# Patient Record
Sex: Male | Born: 1975 | Race: White | Hispanic: No | Smoking: Current every day smoker
Health system: Southern US, Community
[De-identification: ages and names within clinical notes are randomized; demographics above are authoritative.]

## PROBLEM LIST (undated history)

## (undated) DIAGNOSIS — K219 Gastro-esophageal reflux disease without esophagitis: Secondary | ICD-10-CM

## (undated) DIAGNOSIS — I10 Essential (primary) hypertension: Secondary | ICD-10-CM

## (undated) DIAGNOSIS — Z944 Liver transplant status: Secondary | ICD-10-CM

## (undated) DIAGNOSIS — E119 Type 2 diabetes mellitus without complications: Secondary | ICD-10-CM

---

## 2013-04-29 ENCOUNTER — Emergency Department (HOSPITAL_COMMUNITY): Payer: Self-pay

## 2013-04-29 ENCOUNTER — Emergency Department (HOSPITAL_COMMUNITY)
Admission: EM | Admit: 2013-04-29 | Discharge: 2013-04-30 | Disposition: A | Discharge status: Home / Self Care | Payer: Self-pay | Attending: Emergency Medicine | Admitting: Emergency Medicine

## 2013-04-29 ENCOUNTER — Encounter (HOSPITAL_COMMUNITY): Payer: Self-pay | Admitting: Emergency Medicine

## 2013-04-29 DIAGNOSIS — R059 Cough, unspecified: Secondary | ICD-10-CM | POA: Insufficient documentation

## 2013-04-29 DIAGNOSIS — R55 Syncope and collapse: Secondary | ICD-10-CM | POA: Insufficient documentation

## 2013-04-29 DIAGNOSIS — W11XXXA Fall on and from ladder, initial encounter: Secondary | ICD-10-CM | POA: Insufficient documentation

## 2013-04-29 DIAGNOSIS — Y9389 Activity, other specified: Secondary | ICD-10-CM | POA: Insufficient documentation

## 2013-04-29 DIAGNOSIS — J069 Acute upper respiratory infection, unspecified: Secondary | ICD-10-CM | POA: Insufficient documentation

## 2013-04-29 DIAGNOSIS — F29 Unspecified psychosis not due to a substance or known physiological condition: Secondary | ICD-10-CM | POA: Insufficient documentation

## 2013-04-29 DIAGNOSIS — Y929 Unspecified place or not applicable: Secondary | ICD-10-CM | POA: Insufficient documentation

## 2013-04-29 DIAGNOSIS — R6883 Chills (without fever): Secondary | ICD-10-CM | POA: Insufficient documentation

## 2013-04-29 DIAGNOSIS — Y9321 Activity, ice skating: Secondary | ICD-10-CM | POA: Insufficient documentation

## 2013-04-29 DIAGNOSIS — Y990 Civilian activity done for income or pay: Secondary | ICD-10-CM | POA: Insufficient documentation

## 2013-04-29 DIAGNOSIS — S298XXA Other specified injuries of thorax, initial encounter: Secondary | ICD-10-CM | POA: Insufficient documentation

## 2013-04-29 DIAGNOSIS — Z8719 Personal history of other diseases of the digestive system: Secondary | ICD-10-CM | POA: Insufficient documentation

## 2013-04-29 DIAGNOSIS — R05 Cough: Secondary | ICD-10-CM | POA: Insufficient documentation

## 2013-04-29 DIAGNOSIS — F17200 Nicotine dependence, unspecified, uncomplicated: Secondary | ICD-10-CM | POA: Insufficient documentation

## 2013-04-29 HISTORY — DX: Gastro-esophageal reflux disease without esophagitis: K21.9

## 2013-04-29 NOTE — ED Provider Notes (Signed)
CSN: [id number]     Arrival date & time 04/29/13  2200 History     First MD Initiated Contact with Patient 04/29/13 2309     Chief Complaint  Patient presents with  . Syncope    (Consider location/radiation/quality/duration/timing/severity/associated sxs/prior Treatment) HPI  37 year old male presents to the emergency department with chief complaint of cough and syncope.  The patient states that last night he began having paroxysms of severe or coughing.  Nonproductive.  He has had intermittent hot and cold chills.  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  Today he was about 5 feet up on his ladder when he became dizzy and fell off the ladder and passed out.  Patient states that his coworkers say he was confused for approximately 5-10 minutes, however it he denies any other symptoms of seizure such as convulsion, loss of bowel or bladder or tongue biting.  She has no history of seizure or syncope.  Patient states that he had a second episode of syncope while he was riding in the car with his friend.  He states that he had a final episode of syncope at home today. He had a paroxysm of cough and felt a ripping pain in his chest. and his girlfriend have him come to the hospital.  The patient is complaining of feeling hot all over.  Unsure if he is running fevers.  Current daily smoker.  He denies a history of DVT or PE.  He denies a history of an unilateral calf pain or swelling.  Past Medical History  Diagnosis Date  . GERD (gastroesophageal reflux disease)    History reviewed. No pertinent past surgical history. Family History  Problem Relation Age of Onset  . Mental illness Mother   . Mental illness Brother   . Emphysema Other   . Kidney failure Other    History  Substance Use Topics  . Smoking status: Current Every Day Smoker -- 1.00 packs/day    Types: Cigarettes  . Smokeless tobacco: Not on file  . Alcohol Use: Yes     Comment: occ     Review of Systems Ten systems reviewed and are negative for acute change, except as noted in the HPI.   Allergies  Review of patient's allergies indicates no known allergies.  Home Medications  No current outpatient prescriptions on file. BP 153/77  Pulse 85  Temp(Src) 99 F (37.2 C) (Oral)  Resp 26  SpO2 99% Physical Exam Physical Exam  Nursing note and vitals reviewed. Constitutional: He appears well-developed and well-nourished. No distress.  HENT:  Head: Normocephalic and atraumatic.  Eyes: Conjunctivae normal are normal. No scleral icterus.  Neck: Normal range of motion. Neck supple.  Cardiovascular: Normal rate, regular rhythm and normal heart sounds.   Pulmonary/Chest: Effort normal and breath sounds normal.  Hacking dry cough..  Abdominal: Soft. There is no tenderness.  Musculoskeletal: He exhibits no edema.  Neurological: He is alert.  Skin: Skin is warm,  And flushed Psychiatric: His behavior is normal.    ED Course   Procedures (including critical care time)  Labs Reviewed  BASIC METABOLIC PANEL - Abnormal; Notable for the following:    Glucose, Bld 108 (*)    All other components within normal limits  CBC   No results found. 1. URI (upper respiratory infection)   2. Syncope   3. Smoker       Date: 04/30/2013  Rate: 85  Rhythm: normal sinus rhythm  QRS Axis: normal  Intervals: normal  ST/T Wave abnormalities: normal  Conduction Disutrbances:borderline intraventricular conduction delay  Narrative Interpretation: abnormal EKG, borderline q waves inferior leads.  Old EKG Reviewed: none available   MDM  Patient with a couple of friends, cough and tearing chest pain.  He has a well score of 0 and he has perked negative.  Concern for her bronchitis versus pneumonia.  Tearing chest pain concerning for chest wall muscle tear versus dissecting aortic aneurysm although his pain is currntly moderate. He is hypertensive. He has taken tylonol cold and  flu without relif and aleve.     1:25 AM  Patient with negative chest x-ray.  I personally reviewed the chest x-rays in our PACS system.. Patient has abnormal EKG with borderline Q waves in the inferior leads.  He also has borderline intraventricular conduction delay. I have reviewed the EKG with Dr. Read Drivers who interprets it as normal. Troponin istat   2:00 AM Patient with negative troponin. I feel the patient is safe for discharge at this point. folllow up with cardiology and community health, Hycodan. Smoking cessation.  Arthor Captain, PA-C 05/01/13 [phone]

## 2013-04-29 NOTE — ED Notes (Signed)
Pt states he started feeling bad last night and woke up this morning and still didn't feel good  Pt states today he has had some pain in his chest but states he has had a cough all day  Pt coughing in triage  Pt states he had a syncopal episode at work today  Pt states this evening he coughed and had a pain run from his chest down his left arm causing it to feel numb  Pt states his cough has been uncontrollable all day

## 2013-04-30 LAB — CBC
MCH: 31 pg (ref 26.0–34.0)
MCHC: 33.8 g/dL (ref 30.0–36.0)
Platelets: 158 10*3/uL (ref 150–400)
RBC: 4.8 MIL/uL (ref 4.22–5.81)

## 2013-04-30 LAB — BASIC METABOLIC PANEL
Calcium: 9.2 mg/dL (ref 8.4–10.5)
GFR calc Af Amer: >90 mL/min (ref >90)
GFR calc non Af Amer: >90 mL/min (ref >90)
Sodium: 140 mEq/L (ref 135–145)

## 2013-04-30 LAB — POCT I-STAT TROPONIN I: Troponin i, poc: 0 ng/mL (ref 0.00–0.08)

## 2013-04-30 MED ORDER — ALBUTEROL SULFATE HFA 108 (90 BASE) MCG/ACT IN AERS: 2.0000 | INHALATION_SPRAY | RESPIRATORY_TRACT | Status: DC | PRN

## 2013-04-30 MED ORDER — HYDROCODONE-HOMATROPINE 5-1.5 MG/5ML PO SYRP
5.0000 mL | ORAL_SOLUTION | Freq: Once | ORAL | Status: AC
  Administered 2013-04-30: 5 mL via ORAL
  Filled 2013-04-30: qty 5

## 2013-04-30 MED ORDER — HYDROCODONE-HOMATROPINE 5-1.5 MG/5ML PO SYRP: 2.5000 mL | ORAL_SOLUTION | Freq: Four times a day (QID) | ORAL | Status: DC | PRN

## 2013-04-30 NOTE — ED Notes (Addendum)
Pt given water at this time 

## 2013-05-04 NOTE — ED Provider Notes (Signed)
Medical screening examination/treatment/procedure(s) were performed by non-physician practitioner and as supervising physician I was immediately available for consultation/collaboration.   Hanley Seamen, MD 05/04/13 [phone]

## 2013-05-06 ENCOUNTER — Emergency Department (HOSPITAL_COMMUNITY)
Admission: EM | Admit: 2013-05-06 | Discharge: 2013-05-07 | Disposition: A | Discharge status: Home / Self Care | Payer: Self-pay | Attending: Emergency Medicine | Admitting: Emergency Medicine

## 2013-05-06 ENCOUNTER — Encounter (HOSPITAL_COMMUNITY): Payer: Self-pay | Admitting: Emergency Medicine

## 2013-05-06 DIAGNOSIS — T675XXA Heat exhaustion, unspecified, initial encounter: Secondary | ICD-10-CM | POA: Insufficient documentation

## 2013-05-06 DIAGNOSIS — E860 Dehydration: Secondary | ICD-10-CM | POA: Insufficient documentation

## 2013-05-06 DIAGNOSIS — F17200 Nicotine dependence, unspecified, uncomplicated: Secondary | ICD-10-CM | POA: Insufficient documentation

## 2013-05-06 DIAGNOSIS — X30XXXA Exposure to excessive natural heat, initial encounter: Secondary | ICD-10-CM | POA: Insufficient documentation

## 2013-05-06 DIAGNOSIS — Y929 Unspecified place or not applicable: Secondary | ICD-10-CM | POA: Insufficient documentation

## 2013-05-06 DIAGNOSIS — Y939 Activity, unspecified: Secondary | ICD-10-CM | POA: Insufficient documentation

## 2013-05-06 LAB — CBC WITH DIFFERENTIAL/PLATELET
Basophils Absolute: 0.1 10*3/uL (ref 0.0–0.1)
Basophils Relative: 0 % (ref 0–1)
MCHC: 34.5 g/dL (ref 30.0–36.0)
Monocytes Absolute: 1.1 10*3/uL — ABNORMAL HIGH (ref 0.1–1.0)
Neutro Abs: 7.8 10*3/uL — ABNORMAL HIGH (ref 1.7–7.7)
Neutrophils Relative %: 67 % (ref 43–77)
Platelets: 174 10*3/uL (ref 150–400)
RDW: 13.9 % (ref 11.5–15.5)

## 2013-05-06 LAB — COMPREHENSIVE METABOLIC PANEL
AST: 154 U/L — ABNORMAL HIGH (ref 0–37)
Albumin: 4.3 g/dL (ref 3.5–5.2)
Chloride: 98 mEq/L (ref 96–112)
Creatinine, Ser: 1.33 mg/dL (ref 0.50–1.35)
Potassium: 4 mEq/L (ref 3.5–5.1)
Total Bilirubin: 0.7 mg/dL (ref 0.3–1.2)

## 2013-05-06 LAB — URINE MICROSCOPIC-ADD ON

## 2013-05-06 LAB — URINALYSIS, ROUTINE W REFLEX MICROSCOPIC
Bilirubin Urine: NEGATIVE
Leukocytes, UA: NEGATIVE
Nitrite: NEGATIVE
Specific Gravity, Urine: 1.021 (ref 1.005–1.030)
pH: 5.5 (ref 5.0–8.0)

## 2013-05-06 LAB — CK: Total CK: 536 U/L — ABNORMAL HIGH (ref 7–232)

## 2013-05-06 MED ORDER — DIPHENHYDRAMINE HCL 50 MG/ML IJ SOLN
25.0000 mg | Freq: Once | INTRAMUSCULAR | Status: AC
  Administered 2013-05-06: 25 mg via INTRAVENOUS
  Filled 2013-05-06: qty 1

## 2013-05-06 MED ORDER — ONDANSETRON HCL 4 MG/2ML IJ SOLN
4.0000 mg | Freq: Once | INTRAMUSCULAR | Status: AC
  Administered 2013-05-06: 4 mg via INTRAVENOUS
  Filled 2013-05-06: qty 2

## 2013-05-06 MED ORDER — SODIUM CHLORIDE 0.9 % IV BOLUS (SEPSIS)
2000.0000 mL | Freq: Once | INTRAVENOUS | Status: AC
  Administered 2013-05-06: 1000 mL via INTRAVENOUS
  Administered 2013-05-06: 2000 mL via INTRAVENOUS

## 2013-05-06 MED ORDER — OXYCODONE-ACETAMINOPHEN 5-325 MG PO TABS: 1.0000 | ORAL_TABLET | Freq: Four times a day (QID) | ORAL | Status: DC | PRN

## 2013-05-06 MED ORDER — MORPHINE SULFATE 4 MG/ML IJ SOLN
4.0000 mg | Freq: Once | INTRAMUSCULAR | Status: AC
  Administered 2013-05-06: 4 mg via INTRAVENOUS
  Filled 2013-05-06: qty 1

## 2013-05-06 NOTE — ED Provider Notes (Signed)
TIME SEEN: 9:07 PM  CHIEF COMPLAINT: Overheated, diffuse cramping  HPI: Patient is a 37 year old male with a history of acid reflux who presents to the emergency department with diffuse body cramping, nausea. 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 He reports he only drank 2 colas today and 1 Gatorade. He states he's had similar symptoms once before with dehydration. He was not admitted at that time. He was seen at [facility]. He denies that he's had any vomiting but he has had nausea. No diarrhea. No hematuria. He has been able to urinate. No fever. He has been sweating today.  ROS: See HPI Constitutional: no fever  Eyes: no drainage  ENT: no runny nose   Cardiovascular:  no chest pain  Resp: no SOB  GI: no vomiting GU: no dysuria Integumentary: no rash  Allergy: no hives  Musculoskeletal: no leg swelling  Neurological: no slurred speech ROS otherwise negative  PAST MEDICAL HISTORY/PAST SURGICAL HISTORY:  Past Medical History  Diagnosis Date  . GERD (gastroesophageal reflux disease)     MEDICATIONS:  Prior to Admission medications   Medication Sig Start Date End Date Taking? Authorizing Provider  albuterol (PROVENTIL HFA;VENTOLIN HFA) 108 (90 BASE) MCG/ACT inhaler Inhale 2 puffs into the lungs every 4 (four) hours as needed for wheezing. 04/30/13   Arthor Captain, PA-C  HYDROcodone-homatropine (HYCODAN) 5-1.5 MG/5ML syrup Take 2.5 mL by mouth every 6 (six) hours as needed for cough. 04/30/13   Arthor Captain, PA-C    ALLERGIES:  No Known Allergies  SOCIAL HISTORY:  History  Substance Use Topics  . Smoking status: Current Every Day Smoker -- 1.00 packs/day    Types: Cigarettes  . Smokeless tobacco: Not on file  . Alcohol Use: Yes     Comment: occ    FAMILY HISTORY: Family History  Problem Relation Age of Onset  . Mental illness Mother   . Mental illness Brother   . Emphysema Other   . Kidney failure Other   .  Gout Father     EXAM: BP 121/71  Pulse 68  Temp(Src) 97.6 F (36.4 C) (Oral)  Resp 24  SpO2 95% CONSTITUTIONAL: Alert and oriented and responds appropriately to questions. Patient appears uncomfortable but is nontoxic HEAD: Normocephalic EYES: Conjunctivae clear, PERRL ENT: normal nose; no rhinorrhea; dry mucous membranes; pharynx without lesions noted NECK: Supple, no meningismus, no LAD  CARD: RRR; S1 and S2 appreciated; no murmurs, no clicks, no rubs, no gallops RESP: Normal chest excursion without splinting or tachypnea; breath sounds clear and equal bilaterally; no wheezes, no rhonchi, no rales,  ABD/GI: Normal bowel sounds; non-distended; soft, non-tender, no rebound, no guarding BACK:  The back appears normal and is non-tender to palpation, there is no CVA tenderness EXT: Normal ROM in all joints; non-tender to palpation; no edema; normal capillary refill; no cyanosis    SKIN: Normal color for age and race; warm NEURO: Moves all extremities equally PSYCH: The patient's mood and manner are appropriate. Grooming and personal hygiene are appropriate.  MEDICAL DECISION MAKING: Patient likely with heat exhaustion. Will obtain labs to evaluate for electrolyte abnormality, rhabdomyolysis. We'll give IV fluids and pain medication. Anticipate discharge home. Vital signs within normal limits.  ED PROGRESS: Patient's labs are unremarkable other than a slightly elevated creatinine at 1.33 and a CK greater than 500. He has received 2 L of IV fluids and has been able to tolerate by mouth without difficulty. He  reports feeling much better. Patient is ready for discharge home. Discussed with patient at length return precautions and importance for staying well hydrated. Instructed patient to stay out of the sun for the next several days. Patient verbalizes understanding is comfortable with this plan.   Layla Maw Chennel Olivos, DO 05/06/13 2328

## 2013-05-06 NOTE — ED Provider Notes (Signed)
D/w signs and symptoms of rhabdomyolysis.  His creatinine is slightly elevated but within normal limits for his size. Pt able to tolerate po and I feel he will be able to keep himself hydrated at home.  Will have him f/u to have Cr rechecked in 2-3 days.  LFTs also slightly elevated.  Pt denies h/o heavy ETOH use.  No RUQ pain on exam or hepatomegaly.  Will have pt f/u for recheck of LFTs.  Layla Maw Ward, DO 05/06/13 2332

## 2013-05-06 NOTE — ED Notes (Signed)
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

## 2014-06-13 ENCOUNTER — Emergency Department (HOSPITAL_COMMUNITY)
Admission: EM | Admit: 2014-06-13 | Discharge: 2014-06-13 | Disposition: A | Discharge status: Home / Self Care | Payer: Self-pay | Attending: Emergency Medicine | Admitting: Emergency Medicine

## 2014-06-13 ENCOUNTER — Encounter (HOSPITAL_COMMUNITY): Payer: Self-pay | Admitting: Emergency Medicine

## 2014-06-13 DIAGNOSIS — Z79899 Other long term (current) drug therapy: Secondary | ICD-10-CM | POA: Insufficient documentation

## 2014-06-13 DIAGNOSIS — K089 Disorder of teeth and supporting structures, unspecified: Secondary | ICD-10-CM | POA: Insufficient documentation

## 2014-06-13 DIAGNOSIS — K219 Gastro-esophageal reflux disease without esophagitis: Secondary | ICD-10-CM | POA: Insufficient documentation

## 2014-06-13 DIAGNOSIS — K0889 Other specified disorders of teeth and supporting structures: Secondary | ICD-10-CM

## 2014-06-13 DIAGNOSIS — F17200 Nicotine dependence, unspecified, uncomplicated: Secondary | ICD-10-CM | POA: Insufficient documentation

## 2014-06-13 DIAGNOSIS — K029 Dental caries, unspecified: Secondary | ICD-10-CM | POA: Insufficient documentation

## 2014-06-13 MED ORDER — AMOXICILLIN 500 MG PO CAPS
500.0000 mg | ORAL_CAPSULE | Freq: Three times a day (TID) | ORAL | Status: DC
Start: 2014-06-13 — End: 2024-02-20

## 2014-06-13 MED ORDER — BUPIVACAINE-EPINEPHRINE (PF) 0.5% -1:200000 IJ SOLN
1.8000 mL | Freq: Once | INTRAMUSCULAR | Status: AC
  Administered 2014-06-13: 1.8 mL
  Filled 2014-06-13: qty 1.8

## 2014-06-13 MED ORDER — OXYCODONE-ACETAMINOPHEN 5-325 MG PO TABS: 1.0000 | ORAL_TABLET | Freq: Four times a day (QID) | ORAL | Status: DC | PRN

## 2014-06-13 NOTE — Discharge Instructions (Signed)
Dental Pain °A tooth ache may be caused by cavities (tooth decay). Cavities expose the nerve of the tooth to air and hot or cold temperatures. It may come from an infection or abscess (also called a boil or furuncle) around your tooth. It is also often caused by dental caries (tooth decay). This causes the pain you are having. °DIAGNOSIS  °Your caregiver can diagnose this problem by exam. °TREATMENT  °· If caused by an infection, it may be treated with medications which kill germs (antibiotics) and pain medications as prescribed by your caregiver. Take medications as directed. °· Only take over-the-counter or prescription medicines for pain, discomfort, or fever as directed by your caregiver. °· Whether the tooth ache today is caused by infection or dental disease, you should see your dentist as soon as possible for further care. °SEEK MEDICAL CARE IF: °The exam and treatment you received today has been provided on an emergency basis only. This is not a substitute for complete medical or dental care. If your problem worsens or new problems (symptoms) appear, and you are unable to meet with your dentist, call or return to this location. °SEEK IMMEDIATE MEDICAL CARE IF:  °· You have a fever. °· You develop redness and swelling of your face, jaw, or neck. °· You are unable to open your mouth. °· You have severe pain uncontrolled by pain medicine. °MAKE SURE YOU:  °· Understand these instructions. °· Will watch your condition. °· Will get help right away if you are not doing well or get worse. °Document Released: 09/02/2005 Document Revised: 11/25/2011 Document Reviewed: 04/20/2008 °ExitCare® Patient Information ©2015 [employer]. This information is not intended to replace advice given to you by your health care provider. Make sure you discuss any questions you have with your health care provider. ° ° °RESOURCE GUIDE ° °Chronic Pain Problems: °Contact [facility]  297-2271 °Patients need to be  referred by their primary care doctor. ° °Insufficient Money for Medicine: °Contact United Way:  call "211" or [facility] 271-5999. ° °No Primary Care Doctor: °Call [facility]  832-8000 - can help you locate a primary care doctor that  accepts your insurance, provides certain services, etc. °Physician Referral Service- 1-800-533-3463 ° °Agencies that provide inexpensive medical care: °[facility]  832-8035 °[facility]  832-7272 °[facility]  [phone] °[facility]'s Clinic  832-4777 °Planned Parenthood  373-0678 °[facility]  272-1050 ° °Medicaid-accepting [place] Providers: °[facility]- [address] ° [phone], Mon-Fri 9am-7pm, Sat 9am-1pm °[facility]- [address], [address] ° [phone] °[facility]- [address] ° [phone] °[facility]- [address] [place] [address] ° [phone] °[facility]- [address], [phone] ° Only accepts [place] Access Medicaid patients after they have their name  applied to their card ° °Self Pay (no insurance) in [place]: °Sickle Cell Patients: Dr Eric Dean, [facility] ° [address], [phone] °[facility]- [address] ° [phone] °      -     [facility] [place]- [address] °      -     [facility]- see information above (Speak to Pam H if you do not have insurance) °      -  [facility]- [address], [phone] °      -  [facility] [place]- [address],  [phone] °      -  [facility]- [address] [place] [address], [phone] °      -  Dr Osei-Bonsu-  [address], [place], [phone] °      -  [facility]- [address], [phone] °      -  [facility]- [address] [place] [address], [phone], also [address], [phone] °      -     [facility]- [address], [phone], 1st & 3rd Saturday   every month, 10am-1pm ° °1) Find a Doctor and Pay Out of Pocket °Although you won't have to find out who is covered by your insurance plan, it is a good idea to ask around and get recommendations. You will then need to call the office and see if the doctor you have chosen will accept you as a new patient and what types of options they offer for patients who are self-pay. Some doctors offer discounts or will set up payment plans for their patients who do not have insurance, but you will need to ask so you aren't surprised when you get to your appointment. ° °2) Contact Your Local Health Department °Not all health departments have doctors that can see patients for sick visits, but many do, so it is worth a call to see if yours does. If you don't know where your local health department is, you can check in your phone book. The CDC also has a tool to help you locate your state's health department, and many state websites also have listings of all of their local health departments. ° °3) Find a Walk-in Clinic °If your illness is not likely to be very severe or complicated, you may want to try a walk in clinic. These are popping up all over the country in pharmacies, drugstores, and shopping centers. They're usually staffed by nurse practitioners or physician assistants that have been trained to treat common illnesses and complaints. They're usually fairly quick and inexpensive. However, if you have serious medical issues or chronic medical problems, these are probably not your best option ° °STD Testing °[place] [facility] [place], [facility], [address], [place], phone [phone] or 1-877-539-9860.  Monday - Friday, call for an appointment. °[place] [facility] [place], [facility], [address], [place], phone [phone] or [phone].  Monday - Friday, call for an  appointment. ° °Abuse/Neglect: °[place] Child Abuse Hotline (336) 641-3795 °[place] Child Abuse Hotline 800-378-5315 (After Hours) ° °Emergency Shelter:   [facility] (336) 271-5985 ° °Maternity Homes: °[facility] (336) 275-9566 °[facility] (704) 372-4663 ° °MRSA Hotline #:   832-7006 ° °[place] Resources ° °[facility] [place]  [employer] [place] [facility] °[address]                 [address]         [address]  °[place]                                               [place]                                [place] °Phone:  349-3220                                  Phone:  [phone]                   Phone:  342-8140 ° °[place] [facility], 342-8316 °[place] [facility] - [facility]- 1-888-581-9988 °      -     [facility] in [place], [address],                                  336-349-4454, Insurance ° °[place] Child Abuse Hotline °(336) 342-1394 or (336) 342-3537 (After Hours) ° ° °Behavioral Health Services ° °Substance Abuse Resources: °[facility]  336-882-2125 °[facility] 336-784-9470 °[facility] 336-285-9073 °[facility] 336-845-3988 °Residential & Outpatient Substance Abuse Program  800-659-3381 ° °Psychological Services: °[facility]  832-9600 °[facility]  378-7881 °[facility], [address], [place], ACCESS LINE: 1-800-853-5163 or 336-641-4981, [email or web address].[email or web address] ° °Dental Assistance ° °If unable to pay or uninsured, contact:  [facility] or [place] [facility] to become qualified for the adult dental clinic. ° °Patients with Medicaid: [facility] °[address], 632-0744 °[address], 510-2600 ° °If unable to pay, or uninsured, contact  [facility] (271-5999) or [place] [facility] (641-3152 in , 842-7733 in [place]) to become qualified for the adult dental clinic ° °Other Low-Cost Community Dental Services: °[facility]- [address], [place], [place], [address], 723-1848, Ext. [address], 2nd and 4th Thursday of the month at 6:30am.  10 clients each day by appointment, can sometimes see walk-in patients if someone does not show for an appointment. °[facility]- [address], [place], [place], [address], [phone] °[facility]- [address], [place], [place], [address], 631-2330 °[place] [facility]- 342-8273 °[facility]- 703-3100 °[facility]- 570-6415 ° °Please make every effort to establish with a primary care physician for routine medical care ° °Adult Health Services  °The [place] [facility] provides a wide range of adult health services. Some of these services are designed to address the healthcare needs of all [place] residents and all services are designed to meet the needs of uninsured/underinsured low income residents. Some services are available to any resident of [place], call [phone] for details. °] °[facility], a new medical clinic for adults, is now open. For more information about the Center and its services please call [phone]. °For information on our [facility]e Health services, click here. ° °For more information on any of the following Department of Public Health programs, including hours of service, click on the highlighted link. ° °SERVICES FOR WOMEN (Adults and Teens) °Family Planning Services provide a full range of birth control options plus education and counseling. New patient visit and annual return visits include a complete examination, pap test as indicated, and other laboratory as indicated. Included is our Regional Vasectomy Program  for men. ° °Maternity Care is provided through pregnancy, including a six week post partum exam. Women who meet eligibility criteria for the Medicaid for Pregnant Women program, receive care free. Other women are charged on a sliding scale according to income. °Note: Our   Dental Clinic provides services to pregnant women who have a Medicaid card. Call (980) 544-5247 for an appointment in [place] or [phone] for an appointment in [facility].  Primary Care for Medicaid [place] Access Women is available through the [place]. As primary care provider for the [place] program, women may designate the [facility] clinic as their primary care provider.  PLEASE CALL R5958090 FOR AN APPOINTMENT FOR THE ABOVE SERVICES IN EITHER [place] OR [place]. Information available in [place] and [place].   Childbirth Education Classes are open to the public and offered to help families prepare for the best possible childbirth experience as well as to promote lifelong health and wellness. Classes are offered throughout the year and meet on the same night once a week for five weeks. Medicaid covers the cost of the classes for the mother-to-be and her partner. For participants without Medicaid, the cost of the class series is $45.00 for the mother-to-be and her partner. Class size is limited and registration is required. For more information or to register call ([phone]. Baby items donated by [employer] and the [employer] are given away during each class series.  SERVICES FOR WOMEN AND MEN Sexually Transmitted Infection appointments, including HIV testing, are available daily (weekdays, except holidays). Call early as same-day appointments are limited. For an appointment in either [facility] or [place], call [phone]. Services are confidential and free of charge.  Skin Testing for Tuberculosis Please call  [phone]. Adult Immunizations are available, usually for a fee. Please call [phone] for details.  PLEASE CALL [phone] FOR AN APPOINTMENT FOR THE ABOVE SERVICES IN EITHER [place] OR [place].   [facility] provides up to the minute recommended vaccines for your travel destination. We also provide essential health and political information to help insure a safe and pleasurable travel experience. This program is self-sustaining, however, fees are very competitive. We are a CERTIFIED YELLOW FEVER IMMUNIZATION approved clinic site. PLEASE CALL [phone] FOR AN APPOINTMENT IN EITHER [place] OR [place].   If you have questions about the services listed above, we want to answer them! Email [place] at: [email or web address] Visiting Services for elderly and the disabled are available to residents of [facility] who are in need of care that compares to the care offered by a nursing home, have needs that can be met by the program, and have [place] Medicaid. Other short term services are available to residents 18 years and older who are unable to meet requirements for eligibility to receive services from a certified home health agency, spend the majority of time at home, and need care for six months or less.  PLEASE CALL H548482 OR ([phone] FOR MORE INFORMATION. Medication Assistance Program serves as a link between pharmaceutical companies and patients to provide low cost or free prescription medications. This servce is available for residents who meet certain income restrictions and have no insurance coverage.  PLEASE CALL [phone] ([place]) OR [phone] ([place]) FOR MORE INFORMATION.  Updated Feb. 21, 2013

## 2014-06-13 NOTE — ED Notes (Signed)
Per pt, states upper left dental pain since Friday-states hole in tooth where filling fell out

## 2014-06-13 NOTE — ED Provider Notes (Signed)
CSN: [id number]     Arrival date & time 06/13/14  0754 History   First MD Initiated Contact with Patient 06/13/14 [phone]     Chief Complaint  Patient presents with  . Dental Pain     (Consider location/radiation/quality/duration/timing/severity/associated sxs/prior Treatment) HPI  Patient presents to the emergency department with a dental complaint. Symptoms began 1 month ago but have progressively been getting worse. The patient has tried to alleviate pain with OTC pain medications and orajel.  Pain rated at a 10/10, characterized as throbbing in nature and located left upper molar. Patient denies fever, night sweats, chills, difficulty swallowing or opening mouth, SOB, nuchal rigidity or decreased ROM of neck.  Patient does not have a dentist and requests a resource guide at discharge. Will get dental insurance in a few months by his new employer    Past Medical History  Diagnosis Date  . GERD (gastroesophageal reflux disease)    History reviewed. No pertinent past surgical history. Family History  Problem Relation Age of Onset  . Mental illness Mother   . Mental illness Brother   . Emphysema Other   . Kidney failure Other   . Gout Father    History  Substance Use Topics  . Smoking status: Current Every Day Smoker -- 1.00 packs/day    Types: Cigarettes  . Smokeless tobacco: Not on file  . Alcohol Use: Yes     Comment: occ    Review of Systems   Review of Systems  Gen: no weight loss, fevers, chills, night sweats  Eyes: no occular draining, occular pain,  No visual changes  Nose: no epistaxis or rhinorrhea  Mouth: + dental pain, no sore throat  Neck: no neck pain  Lungs: No hemoptysis. No wheezing or coughing CV:  No palpitations, dependent edema or orthopnea. No chest pain Abd: no diarrhea. No nausea or vomiting, No abdominal pain  GU: no dysuria or gross hematuria  MSK:  No muscle weakness, No muscular pain Neuro: no headache, no focal neurologic deficits  Skin:  no rash , no wounds Psyche: no complaints of depression or anxiety    Allergies  Dilaudid and Morphine and related  Home Medications   Prior to Admission medications   Medication Sig Start Date End Date Taking? Authorizing Provider  Multiple Vitamin (MULTI-VITAMIN PO) Take 1 tablet by mouth daily.   Yes Historical Provider, MD  omeprazole (PRILOSEC) 20 MG capsule Take 20 mg by mouth daily.   Yes Historical Provider, MD  amoxicillin (AMOXIL) 500 MG capsule Take 1 capsule (500 mg total) by mouth 3 (three) times daily. 06/13/14   Rain Friedt Irine Seal, PA-C  oxyCODONE-acetaminophen (PERCOCET/ROXICET) 5-325 MG per tablet Take 1-2 tablets by mouth every 6 (six) hours as needed for severe pain. 06/13/14   Kashmir Lysaght Irine Seal, PA-C   BP 160/84  Pulse 89  Temp(Src) 98.3 F (36.8 C) (Oral)  Resp 18  SpO2 100% Physical Exam  Nursing note and vitals reviewed. Constitutional: He appears well-developed and well-nourished.  HENT:  Head: Normocephalic and atraumatic.  Mouth/Throat:    Widespread dental decay, no trismus, oral lesion, laceration, abscess, caries, tonsillar abscess. No neck pain. No difficulty breathing. Tolerating secretions well.   Affected tooth has missing dentin with portion of root exposed.  Eyes: Conjunctivae and EOM are normal. Pupils are equal, round, and reactive to light.  Neck: Normal range of motion. Neck supple.  Cardiovascular: Normal rate and regular rhythm.   Pulmonary/Chest: Effort normal and breath sounds normal.  ED Course  Procedures (including critical care time) Labs Review Labs Reviewed - No data to display  Imaging Review No results found.   EKG Interpretation None      MDM   Final diagnoses:  Pain, dental    NERVE BLOCK Date/Time: 9: 26 on 06/13/2014 Performed by: Dorthula Matas Authorized by: Dorthula Matas Consent: Verbal consent obtained. Risks and benefits: risks, benefits and alternatives were discussed Consent given by:  patient Indications: pain relief Body area: face/mouth Laterality: left Needle gauge: 25 G Local anesthetic: lidocaine 2% without epinephrine Anesthetic total: 2 ml Outcome: pain improved Patient tolerance: Patient tolerated the procedure well with no immediate complications. Comments: Patient had complete relief of pain.  Patient has dental pain. No emergent s/sx's present. Patent airway. No trismus.  Will be given pain medication and antibiotics. I discussed the need to call dentist within 24/48 hours for follow-up. Dental referral given. Return to ED precautions given.  Pt voiced understanding and has agreed to follow-up.   38 y.o.Derwin Reddy evaluation in the Emergency Department is complete. It has been determined that no acute conditions requiring further emergency intervention are present at this time. The patient/guardian have been advised of the diagnosis and plan. We have discussed signs and symptoms that warrant return to the ED, such as changes or worsening in symptoms.  Vital signs are stable at discharge. Filed Vitals:   06/13/14 0802  BP: 160/84  Pulse: 89  Temp: 98.3 F (36.8 C)  Resp: 18    Patient/guardian has voiced understanding and agreed to follow-up with the PCP or specialist.      Dorthula Matas, PA-C 06/13/14 [phone]

## 2014-06-13 NOTE — ED Provider Notes (Signed)
Medical screening examination/treatment/procedure(s) were performed by non-physician practitioner and as supervising physician I was immediately available for consultation/collaboration.   EKG Interpretation None       Juliet Rude. Rubin Payor, MD 06/13/14 1536

## 2015-03-18 IMAGING — CR DG CHEST 2V
3 series · 3 of 3 positions shown · non-contrast
Comparison: 12/15/2011

CLINICAL DATA: Cough

CHEST - 2 VIEW

[w chest lat (1 of 2)]
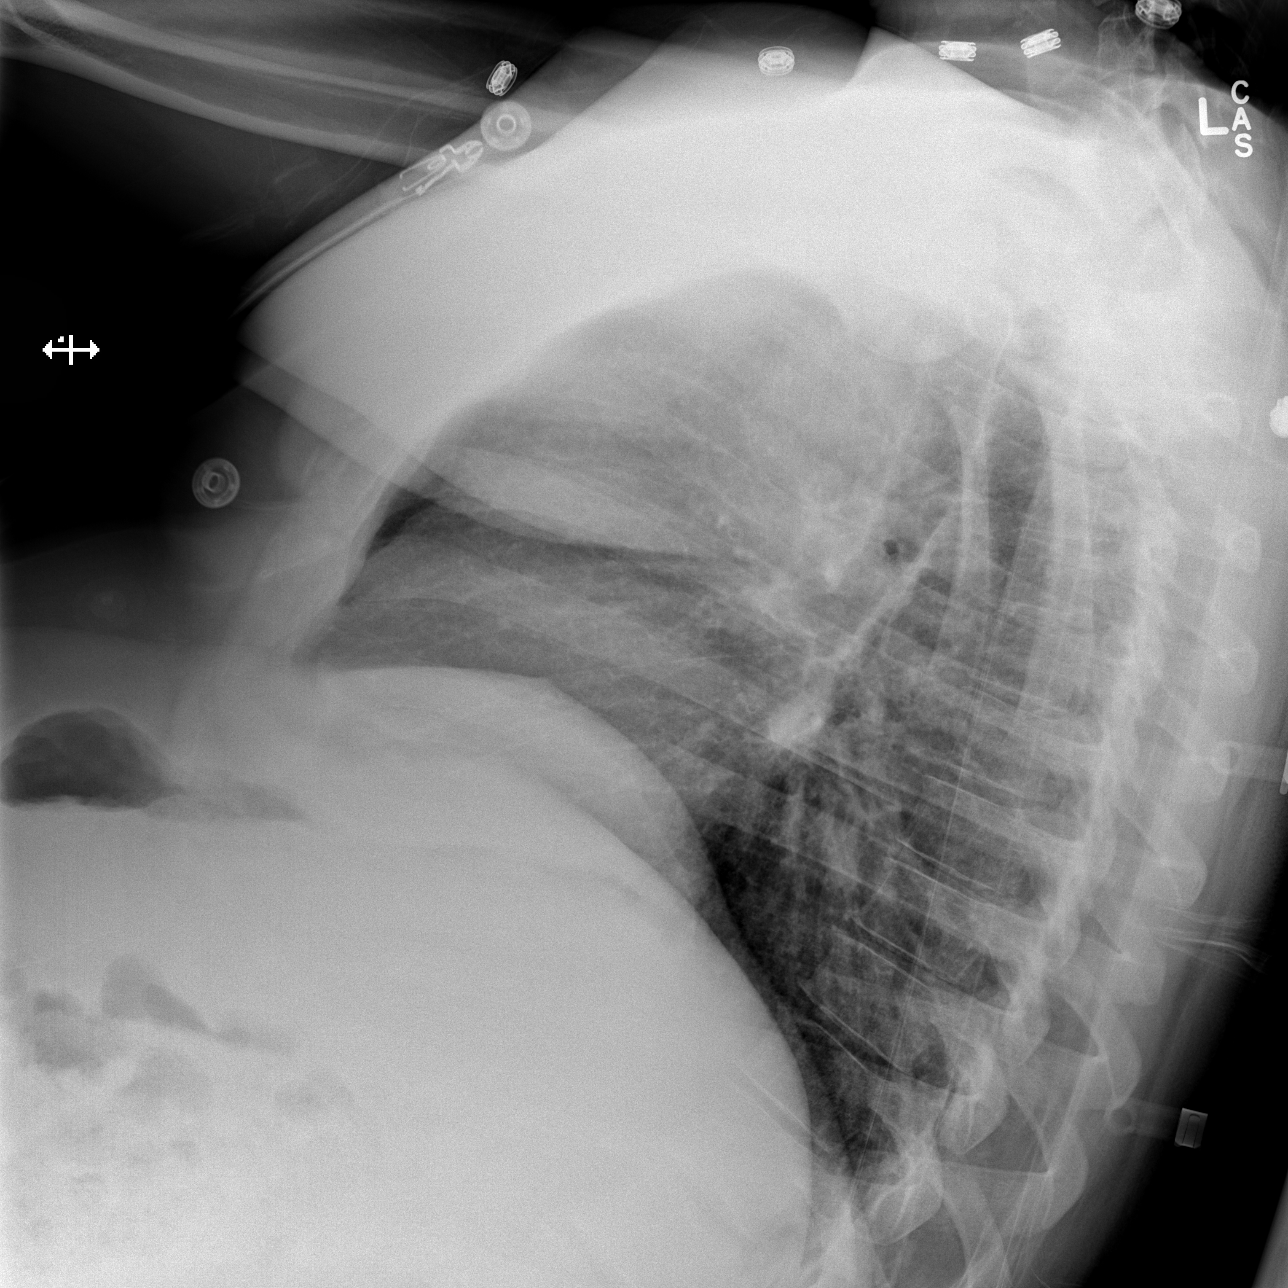

[w chest lat (2 of 2)]
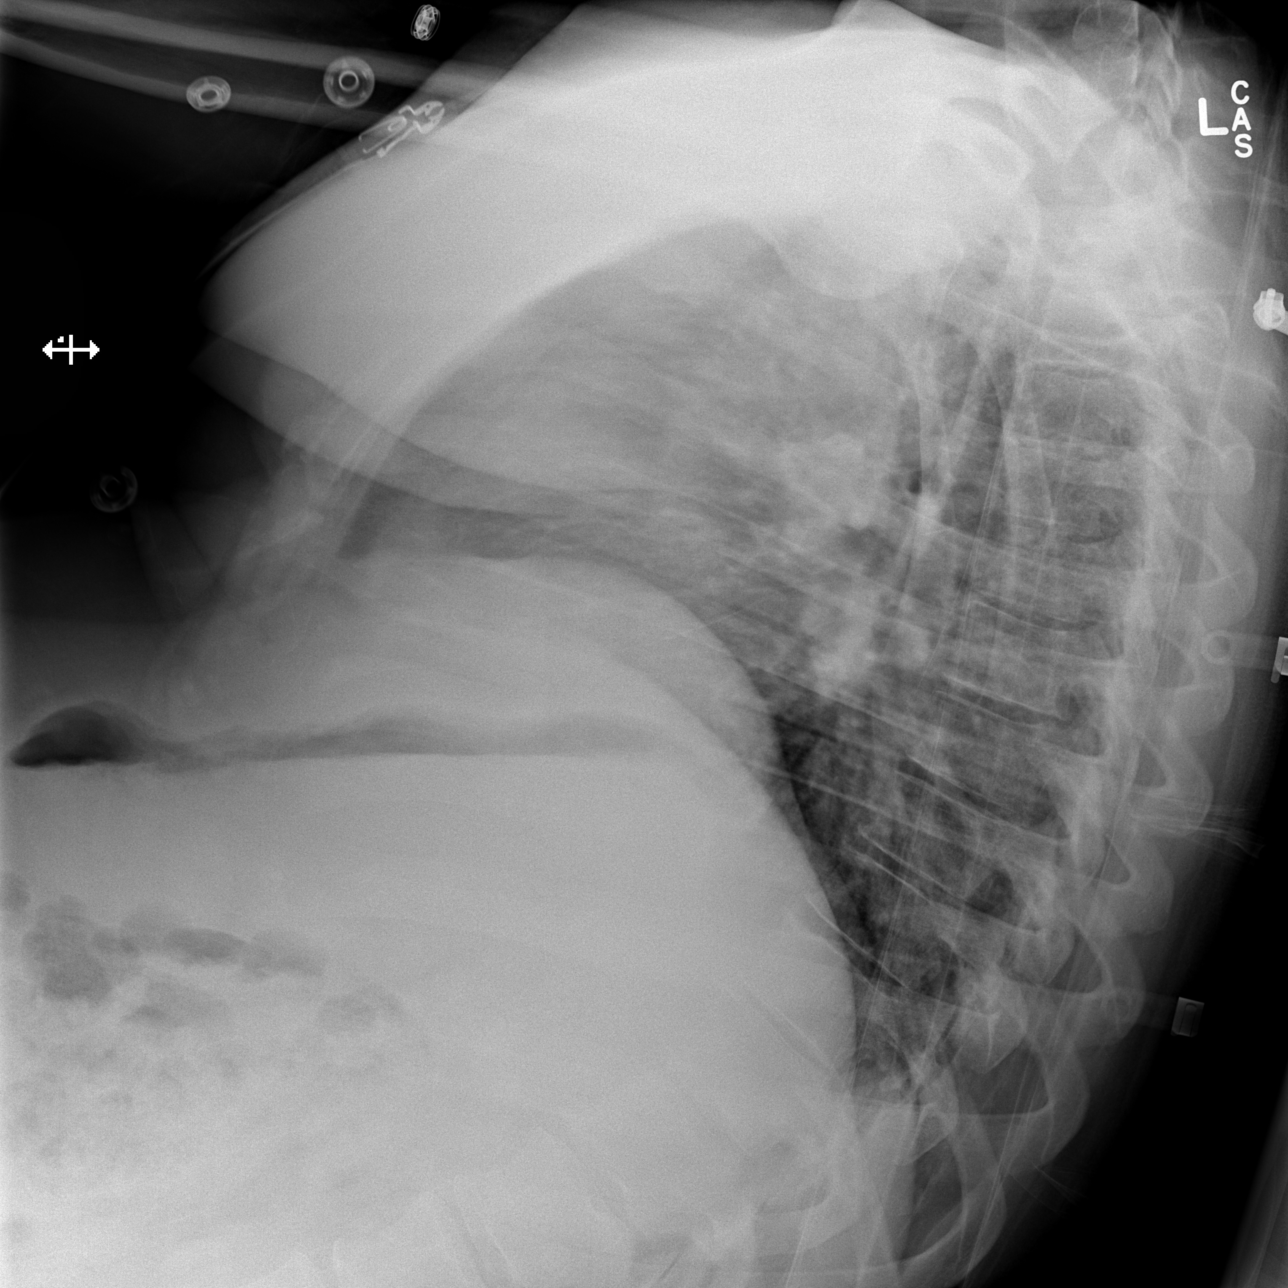

[x chest ap]
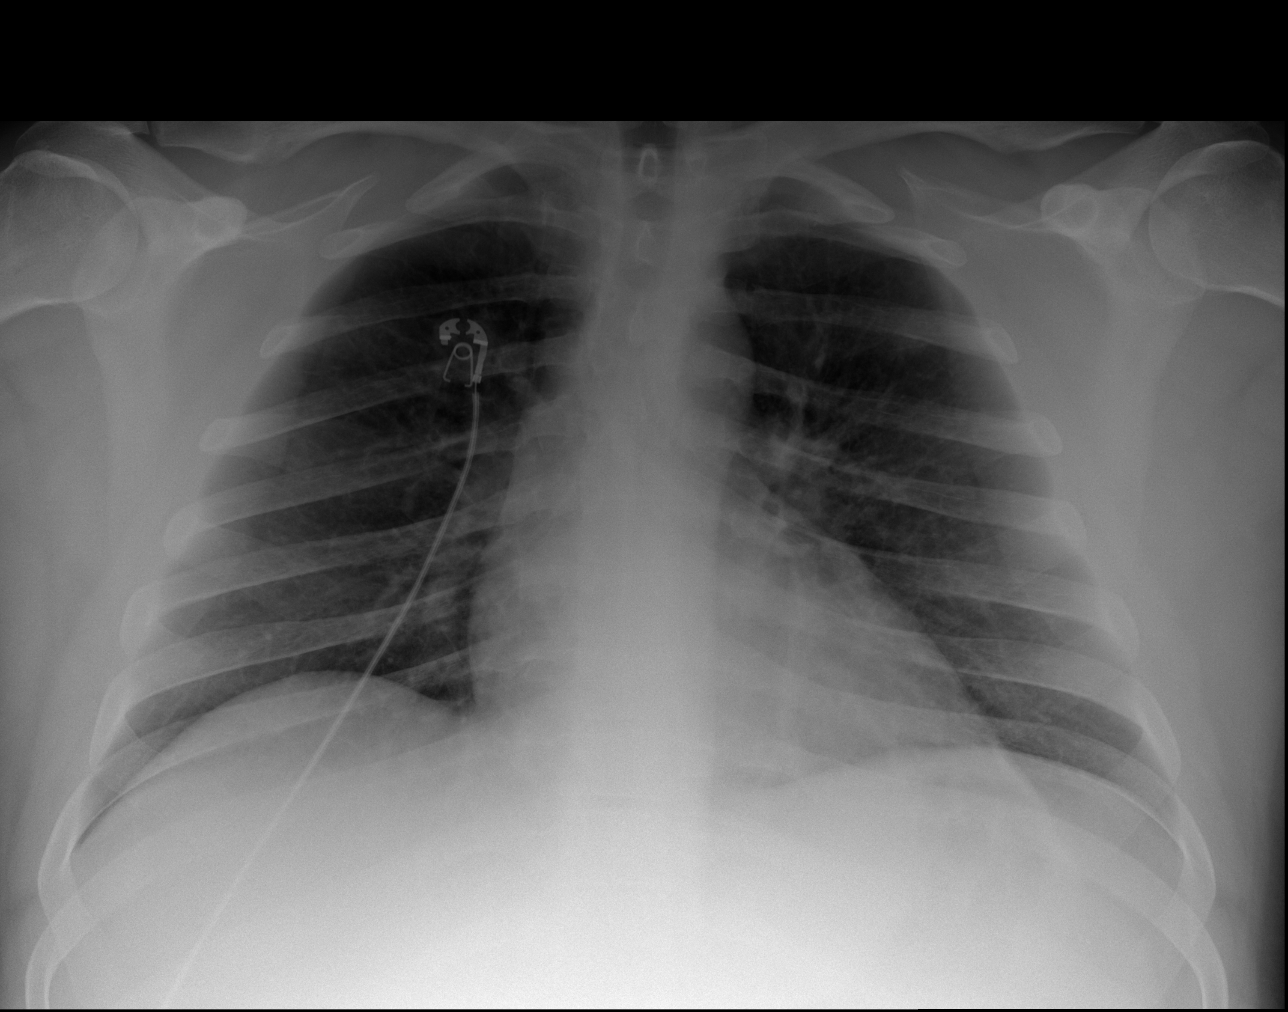

[3 of 3 positions shown; findings below may reference images not displayed]

FINDINGS: Cardiomediastinal silhouette is within normal limits. The
lungs are clear. No pleural effusion.  No pneumothorax.  No acute
osseous abnormality.
IMPRESSION: Normal chest.

## 2024-02-20 ENCOUNTER — Emergency Department (HOSPITAL_COMMUNITY): Payer: Medicare (Managed Care)

## 2024-02-20 ENCOUNTER — Observation Stay (HOSPITAL_COMMUNITY)
Admission: EM | Admit: 2024-02-20 | Discharge: 2024-02-23 | Disposition: A | Discharge status: Home / Self Care | Payer: Medicare (Managed Care) | Attending: Internal Medicine | Admitting: Internal Medicine

## 2024-02-20 ENCOUNTER — Other Ambulatory Visit: Payer: Self-pay

## 2024-02-20 ENCOUNTER — Encounter (HOSPITAL_COMMUNITY): Payer: Self-pay

## 2024-02-20 DIAGNOSIS — Z79620 Long term (current) use of immunosuppressive biologic: Secondary | ICD-10-CM | POA: Diagnosis not present

## 2024-02-20 DIAGNOSIS — N179 Acute kidney failure, unspecified: Secondary | ICD-10-CM | POA: Diagnosis not present

## 2024-02-20 DIAGNOSIS — Z944 Liver transplant status: Secondary | ICD-10-CM | POA: Diagnosis not present

## 2024-02-20 DIAGNOSIS — R112 Nausea with vomiting, unspecified: Secondary | ICD-10-CM | POA: Diagnosis not present

## 2024-02-20 DIAGNOSIS — I82890 Acute embolism and thrombosis of other specified veins: Secondary | ICD-10-CM | POA: Insufficient documentation

## 2024-02-20 DIAGNOSIS — Z794 Long term (current) use of insulin: Secondary | ICD-10-CM | POA: Insufficient documentation

## 2024-02-20 DIAGNOSIS — E139 Other specified diabetes mellitus without complications: Secondary | ICD-10-CM | POA: Diagnosis not present

## 2024-02-20 DIAGNOSIS — K55069 Acute infarction of intestine, part and extent unspecified: Secondary | ICD-10-CM | POA: Diagnosis present

## 2024-02-20 DIAGNOSIS — F10939 Alcohol use, unspecified with withdrawal, unspecified: Secondary | ICD-10-CM | POA: Diagnosis present

## 2024-02-20 DIAGNOSIS — R7989 Other specified abnormal findings of blood chemistry: Secondary | ICD-10-CM | POA: Diagnosis not present

## 2024-02-20 DIAGNOSIS — F419 Anxiety disorder, unspecified: Secondary | ICD-10-CM | POA: Diagnosis not present

## 2024-02-20 DIAGNOSIS — F332 Major depressive disorder, recurrent severe without psychotic features: Secondary | ICD-10-CM | POA: Diagnosis not present

## 2024-02-20 DIAGNOSIS — D696 Thrombocytopenia, unspecified: Secondary | ICD-10-CM | POA: Diagnosis not present

## 2024-02-20 DIAGNOSIS — I10 Essential (primary) hypertension: Secondary | ICD-10-CM | POA: Insufficient documentation

## 2024-02-20 DIAGNOSIS — Z79899 Other long term (current) drug therapy: Secondary | ICD-10-CM | POA: Diagnosis not present

## 2024-02-20 DIAGNOSIS — E785 Hyperlipidemia, unspecified: Secondary | ICD-10-CM | POA: Insufficient documentation

## 2024-02-20 DIAGNOSIS — Z6834 Body mass index (BMI) 34.0-34.9, adult: Secondary | ICD-10-CM | POA: Insufficient documentation

## 2024-02-20 DIAGNOSIS — F10930 Alcohol use, unspecified with withdrawal, uncomplicated: Secondary | ICD-10-CM | POA: Diagnosis not present

## 2024-02-20 DIAGNOSIS — F17210 Nicotine dependence, cigarettes, uncomplicated: Secondary | ICD-10-CM | POA: Diagnosis not present

## 2024-02-20 DIAGNOSIS — R197 Diarrhea, unspecified: Secondary | ICD-10-CM | POA: Insufficient documentation

## 2024-02-20 DIAGNOSIS — F10239 Alcohol dependence with withdrawal, unspecified: Principal | ICD-10-CM | POA: Insufficient documentation

## 2024-02-20 DIAGNOSIS — E66811 Obesity, class 1: Secondary | ICD-10-CM | POA: Insufficient documentation

## 2024-02-20 DIAGNOSIS — F32A Depression, unspecified: Secondary | ICD-10-CM

## 2024-02-20 DIAGNOSIS — R109 Unspecified abdominal pain: Secondary | ICD-10-CM | POA: Diagnosis present

## 2024-02-20 DIAGNOSIS — E119 Type 2 diabetes mellitus without complications: Secondary | ICD-10-CM | POA: Insufficient documentation

## 2024-02-20 DIAGNOSIS — E860 Dehydration: Principal | ICD-10-CM

## 2024-02-20 LAB — CBC WITH DIFFERENTIAL/PLATELET
Abs Immature Granulocytes: 0.04 10*3/uL (ref 0.00–0.07)
Basophils Absolute: 0.1 10*3/uL (ref 0.0–0.1)
Basophils Relative: 1 %
Eosinophils Absolute: 0 10*3/uL (ref 0.0–0.5)
Eosinophils Relative: 0 %
HCT: 47.5 % (ref 39.0–52.0)
Hemoglobin: 16.6 g/dL (ref 13.0–17.0)
Immature Granulocytes: 1 %
Lymphocytes Relative: 9 %
Lymphs Abs: 0.7 10*3/uL (ref 0.7–4.0)
MCH: 33.5 pg (ref 26.0–34.0)
MCHC: 34.9 g/dL (ref 30.0–36.0)
MCV: 96 fL (ref 80.0–100.0)
Monocytes Absolute: 0.7 10*3/uL (ref 0.1–1.0)
Monocytes Relative: 9 %
Neutro Abs: 6.2 10*3/uL (ref 1.7–7.7)
Neutrophils Relative %: 80 %
Platelets: 134 10*3/uL — ABNORMAL LOW (ref 150–400)
RBC: 4.95 MIL/uL (ref 4.22–5.81)
RDW: 13.3 % (ref 11.5–15.5)
WBC: 7.6 10*3/uL (ref 4.0–10.5)
nRBC: 0 % (ref 0.0–0.2)

## 2024-02-20 LAB — COMPREHENSIVE METABOLIC PANEL WITH GFR
ALT: 45 U/L — ABNORMAL HIGH (ref 0–44)
AST: 50 U/L — ABNORMAL HIGH (ref 15–41)
Albumin: 4.7 g/dL (ref 3.5–5.0)
Alkaline Phosphatase: 108 U/L (ref 38–126)
Anion gap: 26 — ABNORMAL HIGH (ref 5–15)
BUN: 24 mg/dL — ABNORMAL HIGH (ref 6–20)
CO2: 12 mmol/L — ABNORMAL LOW (ref 22–32)
Calcium: 9 mg/dL (ref 8.9–10.3)
Chloride: 96 mmol/L — ABNORMAL LOW (ref 98–111)
Creatinine, Ser: 1.66 mg/dL — ABNORMAL HIGH (ref 0.61–1.24)
GFR, Estimated: 51 mL/min — ABNORMAL LOW (ref >60)
Glucose, Bld: 185 mg/dL — ABNORMAL HIGH (ref 70–99)
Potassium: 4.3 mmol/L (ref 3.5–5.1)
Sodium: 134 mmol/L — ABNORMAL LOW (ref 135–145)
Total Bilirubin: 3.2 mg/dL — ABNORMAL HIGH (ref 0.0–1.2)
Total Protein: 7.5 g/dL (ref 6.5–8.1)

## 2024-02-20 LAB — GLUCOSE, CAPILLARY
Glucose-Capillary: 172 mg/dL — ABNORMAL HIGH (ref 70–99)
Glucose-Capillary: 119 mg/dL — ABNORMAL HIGH (ref 70–99)

## 2024-02-20 LAB — PROTIME-INR
INR: 1.2 (ref 0.8–1.2)
Prothrombin Time: 15.1 s (ref 11.4–15.2)

## 2024-02-20 LAB — MAGNESIUM: Magnesium: 1.5 mg/dL — ABNORMAL LOW (ref 1.7–2.4)

## 2024-02-20 LAB — TYPE AND SCREEN
ABO/RH(D): B NEG
Antibody Screen: NEGATIVE

## 2024-02-20 LAB — LIPASE, BLOOD: Lipase: 35 U/L (ref 11–51)

## 2024-02-20 LAB — POC OCCULT BLOOD, ED: Fecal Occult Bld: NEGATIVE

## 2024-02-20 LAB — ETHANOL: Alcohol, Ethyl (B): <15 mg/dL (ref <15)

## 2024-02-20 LAB — HEMOGLOBIN A1C
Hgb A1c MFr Bld: 6.9 % — ABNORMAL HIGH (ref 4.8–5.6)
Mean Plasma Glucose: 151.33 mg/dL

## 2024-02-20 LAB — HIV ANTIBODY (ROUTINE TESTING W REFLEX): HIV Screen 4th Generation wRfx: NONREACTIVE

## 2024-02-20 MED ORDER — LORAZEPAM 2 MG/ML IJ SOLN: 0.0000 mg | Freq: Two times a day (BID) | INTRAMUSCULAR | Status: DC

## 2024-02-20 MED ORDER — TRAZODONE HCL 50 MG PO TABS
200.0000 mg | ORAL_TABLET | Freq: Every day | ORAL | Status: DC
  Administered 2024-02-20 – 2024-02-22 (×3): 200 mg via ORAL
  Filled 2024-02-20 (×3): qty 4

## 2024-02-20 MED ORDER — PANTOPRAZOLE SODIUM 40 MG IV SOLR
40.0000 mg | Freq: Once | INTRAVENOUS | Status: AC
  Administered 2024-02-20: 40 mg via INTRAVENOUS
  Filled 2024-02-20: qty 10

## 2024-02-20 MED ORDER — MYCOPHENOLATE MOFETIL 250 MG PO CAPS
250.0000 mg | ORAL_CAPSULE | Freq: Two times a day (BID) | ORAL | Status: DC
  Administered 2024-02-20 – 2024-02-22 (×5): 250 mg via ORAL
  Filled 2024-02-20 (×5): qty 1

## 2024-02-20 MED ORDER — LORAZEPAM 2 MG/ML IJ SOLN
1.0000 mg | Freq: Once | INTRAMUSCULAR | Status: AC
  Administered 2024-02-20: 1 mg via INTRAVENOUS
  Filled 2024-02-20: qty 1

## 2024-02-20 MED ORDER — TACROLIMUS 1 MG PO CAPS
1.0000 mg | ORAL_CAPSULE | Freq: Every day | ORAL | Status: DC
  Administered 2024-02-20 – 2024-02-22 (×3): 1 mg via ORAL
  Filled 2024-02-20 (×3): qty 1

## 2024-02-20 MED ORDER — ENOXAPARIN SODIUM 40 MG/0.4ML IJ SOSY
40.0000 mg | PREFILLED_SYRINGE | INTRAMUSCULAR | Status: DC
  Administered 2024-02-20: 40 mg via SUBCUTANEOUS
  Filled 2024-02-20: qty 0.4

## 2024-02-20 MED ORDER — TACROLIMUS 1 MG PO CAPS
2.0000 mg | ORAL_CAPSULE | Freq: Every morning | ORAL | Status: DC
  Administered 2024-02-21 – 2024-02-22 (×2): 2 mg via ORAL
  Filled 2024-02-20 (×2): qty 2

## 2024-02-20 MED ORDER — FLUOXETINE HCL 20 MG PO CAPS
20.0000 mg | ORAL_CAPSULE | Freq: Every day | ORAL | Status: DC
  Administered 2024-02-20 – 2024-02-22 (×3): 20 mg via ORAL
  Filled 2024-02-20 (×3): qty 1

## 2024-02-20 MED ORDER — ONDANSETRON HCL 4 MG PO TABS: 4.0000 mg | ORAL_TABLET | Freq: Four times a day (QID) | ORAL | Status: DC | PRN

## 2024-02-20 MED ORDER — SODIUM CHLORIDE 0.9 % IV SOLN: INTRAVENOUS | Status: DC

## 2024-02-20 MED ORDER — INSULIN ASPART 100 UNIT/ML IJ SOLN
0.0000 [IU] | Freq: Three times a day (TID) | INTRAMUSCULAR | Status: DC
  Administered 2024-02-21: 3 [IU] via SUBCUTANEOUS
  Administered 2024-02-21 (×2): 2 [IU] via SUBCUTANEOUS
  Administered 2024-02-22 (×3): 3 [IU] via SUBCUTANEOUS

## 2024-02-20 MED ORDER — LORAZEPAM 2 MG/ML IJ SOLN: 1.0000 mg | INTRAMUSCULAR | Status: DC | PRN

## 2024-02-20 MED ORDER — TACROLIMUS 1 MG PO CAPS: 1.0000 mg | ORAL_CAPSULE | ORAL | Status: DC

## 2024-02-20 MED ORDER — METOCLOPRAMIDE HCL 5 MG/ML IJ SOLN
5.0000 mg | Freq: Once | INTRAMUSCULAR | Status: AC
  Administered 2024-02-20: 5 mg via INTRAVENOUS
  Filled 2024-02-20: qty 2

## 2024-02-20 MED ORDER — OLANZAPINE 5 MG PO TABS
5.0000 mg | ORAL_TABLET | Freq: Every day | ORAL | Status: DC
  Administered 2024-02-20 – 2024-02-22 (×3): 5 mg via ORAL
  Filled 2024-02-20 (×3): qty 1

## 2024-02-20 MED ORDER — THIAMINE HCL 100 MG/ML IJ SOLN: 100.0000 mg | Freq: Every day | INTRAMUSCULAR | Status: DC

## 2024-02-20 MED ORDER — ACAMPROSATE CALCIUM 333 MG PO TBEC
666.0000 mg | DELAYED_RELEASE_TABLET | Freq: Three times a day (TID) | ORAL | Status: DC
  Administered 2024-02-21 – 2024-02-22 (×6): 666 mg via ORAL
  Filled 2024-02-20 (×8): qty 2

## 2024-02-20 MED ORDER — GLYCOPYRROLATE 1 MG PO TABS
1.0000 mg | ORAL_TABLET | Freq: Two times a day (BID) | ORAL | Status: DC
  Administered 2024-02-20 – 2024-02-22 (×5): 1 mg via ORAL
  Filled 2024-02-20 (×6): qty 1

## 2024-02-20 MED ORDER — ONDANSETRON HCL 4 MG/2ML IJ SOLN: 4.0000 mg | Freq: Four times a day (QID) | INTRAMUSCULAR | Status: DC | PRN

## 2024-02-20 MED ORDER — MAGNESIUM SULFATE 2 GM/50ML IV SOLN
2.0000 g | Freq: Once | INTRAVENOUS | Status: AC
  Administered 2024-02-20: 2 g via INTRAVENOUS
  Filled 2024-02-20: qty 50

## 2024-02-20 MED ORDER — CLONAZEPAM 0.5 MG PO TABS
0.5000 mg | ORAL_TABLET | Freq: Two times a day (BID) | ORAL | Status: DC | PRN
  Administered 2024-02-22: 0.5 mg via ORAL
  Filled 2024-02-20: qty 1

## 2024-02-20 MED ORDER — SODIUM CHLORIDE 0.9 % IV SOLN: INTRAVENOUS | Status: AC

## 2024-02-20 MED ORDER — IOHEXOL 350 MG/ML SOLN
100.0000 mL | Freq: Once | INTRAVENOUS | Status: AC | PRN
  Administered 2024-02-20: 100 mL via INTRAVENOUS

## 2024-02-20 MED ORDER — APIXABAN 5 MG PO TABS
5.0000 mg | ORAL_TABLET | Freq: Two times a day (BID) | ORAL | Status: DC
  Administered 2024-02-20 – 2024-02-22 (×5): 5 mg via ORAL
  Filled 2024-02-20 (×5): qty 1

## 2024-02-20 MED ORDER — AMLODIPINE BESYLATE 5 MG PO TABS
5.0000 mg | ORAL_TABLET | Freq: Every day | ORAL | Status: DC
  Administered 2024-02-20 – 2024-02-22 (×3): 5 mg via ORAL
  Filled 2024-02-20 (×3): qty 1

## 2024-02-20 MED ORDER — INSULIN GLARGINE-YFGN 100 UNIT/ML ~~LOC~~ SOLN
20.0000 [IU] | Freq: Every day | SUBCUTANEOUS | Status: DC
  Administered 2024-02-20 – 2024-02-22 (×3): 20 [IU] via SUBCUTANEOUS
  Filled 2024-02-20 (×3): qty 0.2

## 2024-02-20 MED ORDER — THIAMINE MONONITRATE 100 MG PO TABS
100.0000 mg | ORAL_TABLET | Freq: Every day | ORAL | Status: DC
  Administered 2024-02-20 – 2024-02-22 (×3): 100 mg via ORAL
  Filled 2024-02-20 (×3): qty 1

## 2024-02-20 MED ORDER — ADULT MULTIVITAMIN W/MINERALS CH
1.0000 | ORAL_TABLET | Freq: Every day | ORAL | Status: DC
  Administered 2024-02-20 – 2024-02-22 (×3): 1 via ORAL
  Filled 2024-02-20 (×3): qty 1

## 2024-02-20 MED ORDER — PANTOPRAZOLE SODIUM 40 MG PO TBEC
40.0000 mg | DELAYED_RELEASE_TABLET | Freq: Every day | ORAL | Status: DC
  Administered 2024-02-20 – 2024-02-22 (×3): 40 mg via ORAL
  Filled 2024-02-20 (×3): qty 1

## 2024-02-20 MED ORDER — SODIUM CHLORIDE 0.9 % IV BOLUS
1000.0000 mL | Freq: Once | INTRAVENOUS | Status: AC
  Administered 2024-02-20: 1000 mL via INTRAVENOUS

## 2024-02-20 MED ORDER — GEMFIBROZIL 600 MG PO TABS
600.0000 mg | ORAL_TABLET | Freq: Two times a day (BID) | ORAL | Status: DC
  Administered 2024-02-21 – 2024-02-22 (×4): 600 mg via ORAL
  Filled 2024-02-20 (×5): qty 1

## 2024-02-20 MED ORDER — SODIUM CHLORIDE 0.9% FLUSH
3.0000 mL | Freq: Two times a day (BID) | INTRAVENOUS | Status: DC
  Administered 2024-02-21 – 2024-02-22 (×3): 3 mL via INTRAVENOUS

## 2024-02-20 MED ORDER — LORAZEPAM 2 MG/ML IJ SOLN
0.0000 mg | Freq: Four times a day (QID) | INTRAMUSCULAR | Status: AC
  Administered 2024-02-20 – 2024-02-21 (×2): 1 mg via INTRAVENOUS
  Administered 2024-02-21: 3 mg via INTRAVENOUS
  Administered 2024-02-22: 1 mg via INTRAVENOUS
  Filled 2024-02-20 (×2): qty 1
  Filled 2024-02-20: qty 2
  Filled 2024-02-20: qty 1

## 2024-02-20 MED ORDER — LORAZEPAM 1 MG PO TABS
1.0000 mg | ORAL_TABLET | ORAL | Status: DC | PRN
  Administered 2024-02-20 – 2024-02-21 (×4): 1 mg via ORAL
  Administered 2024-02-21 (×2): 2 mg via ORAL
  Administered 2024-02-22: 1 mg via ORAL
  Administered 2024-02-22: 2 mg via ORAL
  Administered 2024-02-22: 1 mg via ORAL
  Filled 2024-02-20: qty 2
  Filled 2024-02-20: qty 1
  Filled 2024-02-20: qty 2
  Filled 2024-02-20 (×2): qty 1
  Filled 2024-02-20: qty 2
  Filled 2024-02-20 (×3): qty 1

## 2024-02-20 MED ORDER — INSULIN ASPART 100 UNIT/ML IJ SOLN: 0.0000 [IU] | Freq: Every day | INTRAMUSCULAR | Status: DC

## 2024-02-20 MED ORDER — FOLIC ACID 1 MG PO TABS
1.0000 mg | ORAL_TABLET | Freq: Every day | ORAL | Status: DC
  Administered 2024-02-20 – 2024-02-22 (×3): 1 mg via ORAL
  Filled 2024-02-20 (×3): qty 1

## 2024-02-20 MED ORDER — ALBUTEROL SULFATE (2.5 MG/3ML) 0.083% IN NEBU: 2.5000 mg | INHALATION_SOLUTION | Freq: Four times a day (QID) | RESPIRATORY_TRACT | Status: DC | PRN

## 2024-02-20 NOTE — ED Triage Notes (Signed)
 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 C/O abd pain, diarrhea, and vomiting. Pt diaphoretic and shaking.

## 2024-02-20 NOTE — ED Notes (Signed)
 Admitting has seen

## 2024-02-20 NOTE — ED Notes (Signed)
 Pt to CT scan.

## 2024-02-20 NOTE — Progress Notes (Signed)
 NEW ADMISSION NOTE New Admission Note:   Arrival Method: stretcher, walked to bed Mental Orientation: alert x 4 Telemetry: box 7 verified Assessment: Completed Skin: intact IV: NS infusing L AC Pain:  denies Tubes: Safety Measures: Safety Fall Prevention Plan has been given, discussed and signed Admission: Completed [facility] Orientation: Patient has been orientated to the room, unit and staff.  Family: in an argument with fiancee. Does not want any family notified.   Orders have been reviewed and implemented. Will continue to monitor the patient. Call light has been placed within reach and bed alarm has been activated. Educated on ativan and CIWA monitoring.   Janiyla Long A Proctor-Gann, RN

## 2024-02-20 NOTE — ED Provider Notes (Signed)
 [place] EMERGENCY DEPARTMENT AT [facility] Provider Note   CSN: [id number] Arrival date & time: 02/20/24  2130     History  Chief Complaint  Patient presents with   Alcohol Problem    Steve Krueger is a 48 y.o. male.  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  Patient states that he has been drinking alcohol daily for the past several days.  He has been doing this due to his current situation with his fiance.  States that he is drinking about a gallon of vodka.  Denies any other illicit drug use.  No fever or chills.  States that he has not had any hematemesis.  He is supposed to be on Eliquis but has not had this for several days due to being kicked out of his residence.  He has been staying in a hotel.  Denies any SI or HI.  Says his last drink was yesterday.  He endorses withdrawal symptoms consisting of diaphoresis, palpitations and shaking.       Home Medications Prior to Admission medications   Medication Sig Start Date End Date Taking? Authorizing Provider  amoxicillin  (AMOXIL ) 500 MG capsule Take 1 capsule (500 mg total) by mouth 3 (three) times daily. 06/13/14   Amil Balding, PA-C  Multiple Vitamin (MULTI-VITAMIN PO) Take 1 tablet by mouth daily.    [provider]  omeprazole (PRILOSEC) 20 MG capsule Take 20 mg by mouth daily.    [provider]  oxyCODONE -acetaminophen  (PERCOCET/ROXICET) 5-325 MG per tablet Take 1-2 tablets by mouth every 6 (six) hours as needed for severe pain. 06/13/14   Amil Balding, PA-C      Allergies    Dilaudid [hydromorphone hcl] and Morphine  and codeine    Review of Systems   Review of Systems  All other systems reviewed and are negative.   Physical Exam Updated Vital Signs BP (!) 150/105   Pulse (!) 105   Temp 98 F (36.7 C) (Oral)   Resp 20   Ht 1.803 m  (5\' 11" )   Wt 113.4 kg   SpO2 99%   BMI 34.87 kg/m  Physical Exam Vitals and nursing note reviewed.  Constitutional:      General: He is not in acute distress.    Appearance: Normal appearance. He is well-developed. He is not toxic-appearing.  HENT:     Head: Normocephalic and atraumatic.  Eyes:     General: Lids are normal.     Conjunctiva/sclera: Conjunctivae normal.     Pupils: Pupils are equal, round, and reactive to light.  Neck:     Thyroid: No thyroid mass.     Trachea: No tracheal deviation.  Cardiovascular:     Rate and Rhythm: Regular rhythm. Tachycardia present.     Heart sounds: Normal heart sounds. No murmur heard.    No gallop.  Pulmonary:     Effort: Pulmonary effort is normal. No respiratory distress.     Breath sounds: Normal breath sounds. No stridor. No decreased breath sounds, wheezing, rhonchi or rales.  Abdominal:     General: There is no distension.     Palpations: Abdomen is soft.     Tenderness: There is abdominal tenderness in the epigastric area. There is no guarding or rebound.  Musculoskeletal:        General: No tenderness. Normal range of motion.  Cervical back: Normal range of motion and neck supple.  Skin:    General: Skin is warm and dry.     Findings: No abrasion or rash.  Neurological:     Mental Status: He is alert and oriented to person, place, and time. Mental status is at baseline.     GCS: GCS eye subscore is 4. GCS verbal subscore is 5. GCS motor subscore is 6.     Cranial Nerves: No cranial nerve deficit.     Sensory: No sensory deficit.     Motor: Motor function is intact.  Psychiatric:        Attention and Perception: Attention normal.        Mood and Affect: Mood is anxious.        Speech: Speech normal.        Behavior: Behavior normal.     ED Results / Procedures / Treatments   Labs (all labs ordered are listed, but only abnormal results are displayed) Labs Reviewed  ETHANOL  CBC WITH DIFFERENTIAL/PLATELET   COMPREHENSIVE METABOLIC PANEL WITH GFR  LIPASE, BLOOD  PROTIME-INR  TYPE AND SCREEN    EKG None  Radiology No results found.  Procedures Procedures    Medications Ordered in ED Medications  sodium chloride  0.9 % bolus 1,000 mL (has no administration in time range)  0.9 %  sodium chloride  infusion (has no administration in time range)  pantoprazole (PROTONIX) injection 40 mg (has no administration in time range)  metoCLOPramide (REGLAN) injection 5 mg (has no administration in time range)  LORazepam (ATIVAN) injection 1 mg (has no administration in time range)    ED Course/ Medical Decision Making/ A&P                                 Medical Decision Making Amount and/or Complexity of Data Reviewed Labs: ordered. Radiology: ordered.  Risk Prescription drug management.   Patient here with withdrawal symptoms.  He has tremors and diaphoresis.  He was given Ativan x 2.  His CIWA was 7 initially.  He has no abdominal discomfort and CT angiography showed no evidence of obstruction.  He is acidotic with a CO2 of 12.  Patient does have evidence of acute kidney injury.  Was given IV hydration.  Continues to endorse weakness.  Patiently to be admitted for observation and will contact hospitalist team        Final Clinical Impression(s) / ED Diagnoses Final diagnoses:  None    Rx / DC Orders ED Discharge Orders     None         Lind Repine, MD 02/20/24 1437

## 2024-02-20 NOTE — H&P (Signed)
 History and Physical    Patient: Steve Krueger:[id number] DOB: 04-Jul-1976 DOA: 02/20/2024 DOS: the patient was seen and examined on 02/20/2024 PCP: Default, Provider, MD  Patient coming from: Home  Chief Complaint:  Chief Complaint  Patient presents with   Alcohol Problem   HPI: Steve Krueger is a 48 y.o. male with medical history significant of liver transplant in 2022 2/2 alpha 1 antitrypsin deficiency, diabetes mellitus type 2, and GERD presents with complaints of abdominal pain, nausea, vomiting, and diarrhea.  He has consumed approximately a gallon of vodka over the last three days, a significant increase from his usual intake of none. This change is attributed to personal issues, including arguments with his fiance and her parents, leading to the cancellation of their wedding plans it seems.  He has experienced nausea and vomiting of bile, along with severe diarrhea over the past two days. The diarrhea was initially dark, described as 'blackish green,' but has since lightened to a yellowish color. No blood reported in the emesis or stool.  He has a history of a liver transplant in 2022 due to alpha-1 antitrypsin deficiency and has not taken his anti-rejection medication for the past two days.  He reports a tremor in his hands, which is not normally present.  He uses a CPAP machine for sleep apnea and smokes about a pack of cigarettes per day.  In the emergency department patient was noted to be afebrile with heart rate elevated up to 105, respirations 16-34, blood pressures elevated up to 159/107, and O2 saturations maintained on room air.  Labs significant for platelets 134, sodium 124, CO2 12, BUN 24,  creatinine 1.66, anion gap 26, AST 50, ALT 45, and total bilirubin 3.2.  Patient had been given 1 L normal saline IV fluids, Ativan IV, and Protonix 40 mg IV.  Review of Systems: As mentioned in the history of present illness. All other systems reviewed and are  negative. Past Medical History:  Diagnosis Date   GERD (gastroesophageal reflux disease)    History reviewed. No pertinent surgical history. Social History:  reports that he has been smoking cigarettes. He does not have any smokeless tobacco history on file. He reports current alcohol use. He reports that he does not use drugs.  Allergies  Allergen Reactions   Dilaudid [Hydromorphone Hcl] Itching    Severe sweating   Morphine  And Codeine Itching    Family History  Problem Relation Age of Onset   Mental illness Mother    Mental illness Brother    Emphysema Other    Kidney failure Other    Gout Father     Prior to Admission medications   Medication Sig Start Date End Date Taking? Authorizing Provider  acamprosate (CAMPRAL) 333 MG tablet Take 666 mg by mouth 3 (three) times daily with meals.   Yes [provider]  amLODipine (NORVASC) 5 MG tablet Take 5 mg by mouth daily. 04/18/23 04/17/24 Yes [provider]  apixaban (ELIQUIS) 5 MG TABS tablet Take 5 mg by mouth 2 (two) times daily. 05/08/23  Yes [provider]  clonazePAM (KLONOPIN) 0.5 MG tablet Take 1 tablet by mouth 2 (two) times daily as needed. 08/29/23  Yes [provider]  FLUoxetine (PROZAC) 20 MG capsule Take 20 mg by mouth daily. 01/23/21 10/20/24 Yes [provider]  gemfibrozil (LOPID) 600 MG tablet Take 600 mg by mouth 2 (two) times daily before a meal. 08/30/22  Yes [provider]  glycopyrrolate (ROBINUL) 1  MG tablet Take 1 mg by mouth 2 (two) times daily.   Yes [provider]  insulin degludec (TRESIBA) 100 UNIT/ML FlexTouch Pen Inject 40 Units into the skin daily. 01/21/24  Yes [provider]  lisinopril (ZESTRIL) 20 MG tablet Take 20 mg by mouth daily. 10/16/22 10/19/24 Yes [provider]  metFORMIN (GLUCOPHAGE-XR) 500 MG 24 hr tablet Take 1 tablet by mouth 2 (two) times daily. 11/06/22 01/29/25 Yes [provider]  mycophenolate  (CELLCEPT) 250 MG capsule Take 250 mg by mouth 2 (two) times daily. 02/21/21 01/25/25 Yes [provider]  NOVOLIN R 100 UNIT/ML injection Inject 20 Units into the skin 2 (two) times daily before a meal. 01/30/21  Yes [provider]  OLANZapine (ZYPREXA) 5 MG tablet Take 1 tablet by mouth at bedtime. 09/28/21  Yes [provider]  omeprazole (PRILOSEC) 20 MG capsule Take 20 mg by mouth daily.   Yes [provider]  ondansetron  (ZOFRAN ) 4 MG tablet Take 4 mg by mouth every 8 (eight) hours as needed for vomiting or nausea. 07/09/19  Yes [provider]  Semaglutide, 2 MG/DOSE, 8 MG/3ML SOPN Inject 2 mg into the skin every Monday. 07/10/22  Yes [provider]  sildenafil (VIAGRA) 50 MG tablet Take 75 mg by mouth as needed. 07/22/22  Yes [provider]  Specialty Vitamins Products (MG PLUS PROTEIN) 133 MG TABS Take 1,330 mg by mouth daily. 11/21/23 11/20/24 Yes [provider]  tacrolimus (PROGRAF) 1 MG capsule Take 1-2 mg by mouth See admin instructions. Take 2mg  by mouth every morning and take 1mg  by mouth every evening. 03/30/21 12/02/24 Yes [provider]  traZODone (DESYREL) 100 MG tablet Take 200-250 mg by mouth at bedtime. 11/10/20 07/24/24 Yes [provider]    Physical Exam: Vitals:   02/20/24 1201 02/20/24 1330 02/20/24 1400 02/20/24 1415  BP:  124/74 (!) 143/95 (!) 140/95  Pulse:  89 82 84  Resp:  16 (!) 21 (!) 23  Temp: 98.5 F (36.9 C)     TempSrc: Oral     SpO2:  100% 96% 95%  Weight:      Height:       Constitutional: Obese middle-age male who appears to be in no acute distress Eyes: PERRL, lids and conjunctivae normal ENMT: Mucous membranes are moist. Posterior pharynx clear of any exudate or lesions.Normal dentition.  Neck: normal, supple  Respiratory: clear to auscultation bilaterally, no wheezing, no crackles. Normal respiratory effort. No accessory muscle use.  Cardiovascular: Regular rate  and rhythm, no murmurs / rubs / gallops. No significant extremity edema. 2+ pedal pulses.  Abdomen: no tenderness, no masses palpated.  Bowel sounds positive.  Musculoskeletal: no clubbing / cyanosis. No joint deformity upper and lower extremities. Good ROM, no contractures. Normal muscle tone.  Skin: no rashes, lesions, ulcers. No induration Neurologic: CN 2-12 grossly intact.  Strength 5/5 in all 4 extremities.  Slight tremor noted on physical exam. Psychiatric: Normal judgment and insight. Alert and oriented x 3. Normal mood.   Data Reviewed:  Reviewed labs, imaging, and pertinent records as documented.  Assessment and Plan:  Alcohol withdrawals Acute.  Patient presents after reportedly drinking a gallon of vodka over the last 3 days after possibly breaking off engagement with his fiance. Normally however reported that he does not drink alcohol.   Alcohol level was undetectable.  Noted to be  tremulous on physical exam.  Patient had been started on Ativan. - Admit to a telemetry  bed - CIWA protocols initiated with Ativan as needed - Multivitamin, thiamine, folic acid  Nausea, vomiting, and diarrhea For the last 2 days patient reported having nausea, vomiting, and diarrhea.  No blood reported in emesis and stools. -Monitor intake and output - Aspiration precautions - Elevation head of the bed - Clear liquid diet and advance as tolerated.  Acute kidney injury Creatinine noted to be elevated 1.66 with BUN 24.  Baseline creatinine previously noted to be around 1.3. - Continue normal saline IV fluids 100 mL/h for 1 L - Recheck kidney function in a.m.  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  Home regimen includes Tresiba 40 units daily and Novolin 20 units twice daily. - CBGs before every meal with moderate SSI -Check hemoglobin A1c - Semglee 20 units daily - Adjust insulin regimen as needed  Liver transplant on  chronic immunosuppressive therapy - Continue mycophenolate and CellCept  Elevated liver function tests Acute.  Labs noted AST 50, ALT 45, total bilirubin 3.2.  Suspect secondary to patient's recent use of alcohol use. - Recheck LFTs in a.m.  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 - Continue Eliquis  Thrombocytopenia Acute.  Platelet count 134.   DVT prophylaxis: Eliquis Advance Care Planning:   Code Status: Full Code  Consults: None  Family Communication: None present  Severity of Illness: The appropriate patient status for this patient is OBSERVATION. Observation status is judged to be reasonable and necessary in order to provide the required intensity of service to ensure the patient's safety. The patient's presenting symptoms, physical exam findings, and initial radiographic and laboratory data in the context of their medical condition is felt to place them at decreased risk for further clinical deterioration. Furthermore, it is anticipated that the patient will be medically stable for discharge from the hospital within 2 midnights of admission.   Author: Lena Qualia, MD 02/20/2024 2:57 PM  For on call review [email or web address].

## 2024-02-21 DIAGNOSIS — F10930 Alcohol use, unspecified with withdrawal, uncomplicated: Secondary | ICD-10-CM | POA: Diagnosis not present

## 2024-02-21 DIAGNOSIS — F332 Major depressive disorder, recurrent severe without psychotic features: Secondary | ICD-10-CM | POA: Diagnosis present

## 2024-02-21 DIAGNOSIS — F10239 Alcohol dependence with withdrawal, unspecified: Secondary | ICD-10-CM | POA: Diagnosis not present

## 2024-02-21 LAB — GLUCOSE, CAPILLARY
Glucose-Capillary: 137 mg/dL — ABNORMAL HIGH (ref 70–99)
Glucose-Capillary: 130 mg/dL — ABNORMAL HIGH (ref 70–99)
Glucose-Capillary: 136 mg/dL — ABNORMAL HIGH (ref 70–99)
Glucose-Capillary: 190 mg/dL — ABNORMAL HIGH (ref 70–99)
Glucose-Capillary: 160 mg/dL — ABNORMAL HIGH (ref 70–99)

## 2024-02-21 LAB — COMPREHENSIVE METABOLIC PANEL WITH GFR
ALT: 33 U/L (ref 0–44)
AST: 54 U/L — ABNORMAL HIGH (ref 15–41)
Albumin: 3.9 g/dL (ref 3.5–5.0)
Alkaline Phosphatase: 91 U/L (ref 38–126)
Anion gap: 13 (ref 5–15)
BUN: 11 mg/dL (ref 6–20)
CO2: 15 mmol/L — ABNORMAL LOW (ref 22–32)
Calcium: 8.2 mg/dL — ABNORMAL LOW (ref 8.9–10.3)
Chloride: 105 mmol/L (ref 98–111)
Creatinine, Ser: 1.03 mg/dL (ref 0.61–1.24)
GFR, Estimated: >60 mL/min (ref >60)
Glucose, Bld: 138 mg/dL — ABNORMAL HIGH (ref 70–99)
Potassium: 3.5 mmol/L (ref 3.5–5.1)
Sodium: 133 mmol/L — ABNORMAL LOW (ref 135–145)
Total Bilirubin: 2 mg/dL — ABNORMAL HIGH (ref 0.0–1.2)
Total Protein: 6.7 g/dL (ref 6.5–8.1)

## 2024-02-21 LAB — CBC
HCT: 40.9 % (ref 39.0–52.0)
Hemoglobin: 14.6 g/dL (ref 13.0–17.0)
MCH: 33.1 pg (ref 26.0–34.0)
MCHC: 35.7 g/dL (ref 30.0–36.0)
MCV: 92.7 fL (ref 80.0–100.0)
Platelets: 83 10*3/uL — ABNORMAL LOW (ref 150–400)
RBC: 4.41 MIL/uL (ref 4.22–5.81)
RDW: 13.2 % (ref 11.5–15.5)
WBC: 2.9 10*3/uL — ABNORMAL LOW (ref 4.0–10.5)
nRBC: 0 % (ref 0.0–0.2)

## 2024-02-21 MED ORDER — NICOTINE 14 MG/24HR TD PT24: 14.0000 mg | MEDICATED_PATCH | Freq: Every day | TRANSDERMAL | Status: DC | PRN
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
## 2024-02-21 NOTE — Care Management Obs Status (Signed)
 MEDICARE OBSERVATION STATUS NOTIFICATION   Patient Details  Name: Steve Krueger MRN: [id number] Date of Birth: July 02, 1976   Medicare Observation Status Notification Given:  Yes    Felix Host 02/21/2024, 12:11 PM

## 2024-02-21 NOTE — Progress Notes (Signed)
 Patient's fiancee's father, Asa Lauth, here to drop off clothes to room and place patient medications in car in ED. Patient agreeable and gave car key to Asa Lauth so he could place medications in vehicle. Car key returned and given back to patient by Clinical research associate.

## 2024-02-21 NOTE — Progress Notes (Signed)
 Per patient, trying to get into a rehab facility called [facility] in [place] . Requesting personal information to be faxed over. Spoke with Jessie at [employer] and she requested a Child psychotherapist reach out and go over patient plan of care and health information. Writer will update patient and notify SW.

## 2024-02-21 NOTE — Hospital Course (Addendum)
 47yo with h/o liver transplant (2022) 2* alpha 1 antitrypsin deficiency, DM, tobacco use, OSA on CPAP,  SMV thrombus on Eliquis , and GERD who presented with acute alcohol intoxication.  He drank a gallon of vodka over 3 days, reportedly usually does not drink (but is on acamprosate  as an outpatient so likely has h/o ETOH use d/o). He was brought in for alcohol detox.
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
## 2024-02-22 DIAGNOSIS — F515 Nightmare disorder: Secondary | ICD-10-CM

## 2024-02-22 DIAGNOSIS — F10930 Alcohol use, unspecified with withdrawal, uncomplicated: Secondary | ICD-10-CM | POA: Diagnosis not present

## 2024-02-22 DIAGNOSIS — F10239 Alcohol dependence with withdrawal, unspecified: Secondary | ICD-10-CM | POA: Diagnosis not present

## 2024-02-22 DIAGNOSIS — F332 Major depressive disorder, recurrent severe without psychotic features: Secondary | ICD-10-CM

## 2024-02-22 DIAGNOSIS — F4310 Post-traumatic stress disorder, unspecified: Secondary | ICD-10-CM | POA: Diagnosis not present

## 2024-02-22 LAB — CBC WITH DIFFERENTIAL/PLATELET
Abs Immature Granulocytes: 0.01 10*3/uL (ref 0.00–0.07)
Basophils Absolute: 0 10*3/uL (ref 0.0–0.1)
Basophils Relative: 1 %
Eosinophils Absolute: 0.1 10*3/uL (ref 0.0–0.5)
Eosinophils Relative: 4 %
HCT: 40.4 % (ref 39.0–52.0)
Hemoglobin: 14.4 g/dL (ref 13.0–17.0)
Immature Granulocytes: 0 %
Lymphocytes Relative: 21 %
Lymphs Abs: 0.6 10*3/uL — ABNORMAL LOW (ref 0.7–4.0)
MCH: 33.3 pg (ref 26.0–34.0)
MCHC: 35.6 g/dL (ref 30.0–36.0)
MCV: 93.5 fL (ref 80.0–100.0)
Monocytes Absolute: 0.3 10*3/uL (ref 0.1–1.0)
Monocytes Relative: 12 %
Neutro Abs: 1.7 10*3/uL (ref 1.7–7.7)
Neutrophils Relative %: 62 %
Platelets: 79 10*3/uL — ABNORMAL LOW (ref 150–400)
RBC: 4.32 MIL/uL (ref 4.22–5.81)
RDW: 13.3 % (ref 11.5–15.5)
WBC: 2.8 10*3/uL — ABNORMAL LOW (ref 4.0–10.5)
nRBC: 0 % (ref 0.0–0.2)

## 2024-02-22 LAB — GLUCOSE, CAPILLARY
Glucose-Capillary: 195 mg/dL — ABNORMAL HIGH (ref 70–99)
Glucose-Capillary: 158 mg/dL — ABNORMAL HIGH (ref 70–99)
Glucose-Capillary: 177 mg/dL — ABNORMAL HIGH (ref 70–99)
Glucose-Capillary: 157 mg/dL — ABNORMAL HIGH (ref 70–99)

## 2024-02-22 LAB — COMPREHENSIVE METABOLIC PANEL WITH GFR
ALT: 44 U/L (ref 0–44)
AST: 103 U/L — ABNORMAL HIGH (ref 15–41)
Albumin: 3.8 g/dL (ref 3.5–5.0)
Alkaline Phosphatase: 88 U/L (ref 38–126)
Anion gap: 11 (ref 5–15)
BUN: 14 mg/dL (ref 6–20)
CO2: 20 mmol/L — ABNORMAL LOW (ref 22–32)
Calcium: 8.5 mg/dL — ABNORMAL LOW (ref 8.9–10.3)
Chloride: 106 mmol/L (ref 98–111)
Creatinine, Ser: 1.01 mg/dL (ref 0.61–1.24)
GFR, Estimated: >60 mL/min (ref >60)
Glucose, Bld: 144 mg/dL — ABNORMAL HIGH (ref 70–99)
Potassium: 3.5 mmol/L (ref 3.5–5.1)
Sodium: 137 mmol/L (ref 135–145)
Total Bilirubin: 1.3 mg/dL — ABNORMAL HIGH (ref 0.0–1.2)
Total Protein: 6.7 g/dL (ref 6.5–8.1)

## 2024-02-22 MED ORDER — OLANZAPINE 10 MG IM SOLR: 5.0000 mg | Freq: Two times a day (BID) | INTRAMUSCULAR | Status: DC | PRN

## 2024-02-22 MED ORDER — VITAMIN B-1 100 MG PO TABS: 100.0000 mg | ORAL_TABLET | Freq: Every day | ORAL | Status: AC

## 2024-02-22 MED ORDER — PRAZOSIN HCL 1 MG PO CAPS
1.0000 mg | ORAL_CAPSULE | Freq: Every day | ORAL | Status: DC
  Administered 2024-02-22: 1 mg via ORAL
  Filled 2024-02-22: qty 1

## 2024-02-22 MED ORDER — OLANZAPINE 5 MG PO TABS: 5.0000 mg | ORAL_TABLET | Freq: Two times a day (BID) | ORAL | Status: DC | PRN

## 2024-02-22 MED ORDER — NICOTINE 14 MG/24HR TD PT24: 14.0000 mg | MEDICATED_PATCH | Freq: Every day | TRANSDERMAL | Status: AC | PRN

## 2024-02-22 MED ORDER — FOLIC ACID 1 MG PO TABS: 1.0000 mg | ORAL_TABLET | Freq: Every day | ORAL | Status: AC

## 2024-02-22 MED ORDER — PRAZOSIN HCL 1 MG PO CAPS: 1.0000 mg | ORAL_CAPSULE | Freq: Every day | ORAL | Status: DC

## 2024-02-22 NOTE — Progress Notes (Signed)
 Report given to [facility] at [facility]. Waiting to hear from [facility] when we can call transport. Reported off to night shift RN.

## 2024-02-22 NOTE — TOC Transition Note (Signed)
 Transition of Care [facility]) - Discharge Note   Patient Details  Name: Steve Krueger MRN: [id number] Date of Birth: [birth year]  [facility] CM/SW Contact:  Jeffory Mings, LCSW Phone Number: 02/22/2024, 3:38 PM   Clinical Narrative: Pt for dc to [facility] today. Pt has signed voluntary admission paperwork for [facility] which has been faxed to [facility] and uploaded to pt's EMR. RN to call [facility] transport when pt ready for dc [phone]. SW signing off at dc.   Paullette Boston, MSW, LCSW [phone] (coverage)        Final next level of care: Psychiatric Hospital Barriers to Discharge: Barriers Resolved   Patient Goals and CMS Choice            Discharge Placement                Patient to be transferred to facility by: Safe Transport Name of family member notified: Pt to update family Patient and family notified of of transfer: 02/22/24  Discharge Plan and Services Additional resources added to the After Visit Summary for                                       Social Drivers of Health (SDOH) Interventions SDOH Screenings   Food Insecurity: Food Insecurity Present (02/20/2024)  Housing: High Risk (02/20/2024)  Transportation Needs: No Transportation Needs (02/20/2024)  Recent Concern: Transportation Needs - Unmet Transportation Needs (12/17/2023)   Received from [facility]  Utilities: Not At Risk (02/20/2024)  Financial Resource Strain: Low Risk  (12/17/2023)   Received from [facility]  Physical Activity: Unknown (04/09/2022)   Received from [facility] visits prior to 11/16/2022., [facility]  Social Connections: Unknown (04/09/2022)   Received from [facility] visits prior to 11/16/2022., [facility]  Stress: Patient Declined (04/09/2022)   [facility] from [facility] visits prior to 11/16/2022., [facility]  Tobacco Use: High Risk (02/20/2024)      Readmission Risk Interventions     No data to display

## 2024-02-22 NOTE — Discharge Summary (Signed)
 Physician Discharge Summary   Patient: Steve Krueger MRN: [id number] DOB: 13-Dec-1975  Admit date:     02/20/2024  Discharge date: 02/22/24  Discharge Physician: Lorita Rosa   PCP: Default, Provider, MD   Recommendations at discharge:   You are being discharged to inpatient behavioral health for ongoing psychiatric support Do NOT drink alcohol! Stop smoking - nicotine patch provided  Discharge Diagnoses: Principal Problem:   Alcohol withdrawal (HCC) Active Problems:   Nausea vomiting and diarrhea   AKI (acute kidney injury) (HCC)   Post-transplant diabetes mellitus (HCC)   S/P liver transplant (HCC)   Elevated liver function tests   Superior mesenteric vein thrombosis (HCC)   Major depressive disorder, recurrent severe without psychotic features [facility])    Hospital Course: 48yo with h/o liver transplant (2022) 2* alpha 1 antitrypsin deficiency, DM, tobacco use, OSA on CPAP,  SMV thrombus on Eliquis , and GERD who presented with acute alcohol intoxication.  He drank a gallon of vodka over 3 days, reportedly usually does not drink (but is on acamprosate  as an outpatient so likely has h/o ETOH use d/o). He was brought in for alcohol detox.   Assessment and Plan:  Alcohol withdrawal H/o ETOH use d/o Reports that he was alcohol-free for 2 months at the time of his liver transplant and has done binge drinking periodically since Reports drinking a gallon of vodka over the 3 days PTA after possibly breaking off engagement with his fiance Alcohol level was undetectable Admitted to telemetry -> med surg CIWA protocol initiated with Ativan  as needed Multivitamin, thiamine , folic acid  Continue acamprosate  Medically stable and transferring to inpatient psychiatry today He is planning to go (door to door) to [employer] in [place]  after dc from [facility]   Anxiety and depression Likely impacted by ETOH Continue olanzapine  and Prozac , Klonopin  prn Patient endorses passive SI but does  not have a plan and contracts for safety Psychiatry consulted and patient is accepted for inpatient [facility] care today Psychiatry has added prazosin  Acute kidney injury, resolved Creatinine 1.66 on presentation, now 1.03 Metabolic acidosis is normalizing Tolerating regular diet, does not need further IVF Avoid nephrotoxic agents   Post transplant diabetes mellitus with long-term use of insulin  A1c 6.9, good control On Tresiba and Novolog  at home, can resume Resume metformin   Liver transplant on chronic immunosuppressive therapy Reports 2 months of sobriety prior to transplant with periodic binge drinking since Continue mycophenolate  and CellCept  Continue to follow LFTs as outpatient, elevation is likely associated with ETOH use Strict avoidance of alcohol is essential Leukopenia and stable and thought to be from immunosuppression   HTN Continue amlodipine    HLD Continue gemfibrozil    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 Continue Eliquis    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 and patient was instructed, not to drive, operate heavy machinery, perform activities  at heights, swimming or participation in water activities or provide baby-sitting services while on Pain, Sleep and Anxiety  Medications; until their outpatient Physician has advised to do so again. Also recommended to not to take more than prescribed Pain, Sleep and Anxiety Medications.   Disposition: Inpatient behavioral health Diet recommendation:  Regular diet DISCHARGE MEDICATION: Allergies as of 02/22/2024       Reactions   Dilaudid [hydromorphone Hcl] Itching   Severe sweating   Morphine  And Codeine Itching        Medication List     TAKE these medications    acamprosate  333 MG tablet Commonly known as: CAMPRAL  Take 666 mg by mouth 3 (three) times daily with meals.   amLODipine  5 MG tablet Commonly known as: NORVASC  Take 5 mg by mouth daily.   apixaban  5 MG Tabs tablet Commonly known as: ELIQUIS  Take 5 mg by mouth 2 (two) times daily.   clonazePAM  0.5 MG tablet Commonly known as: KLONOPIN  Take 1 tablet by mouth 2 (two) times daily as needed.   FLUoxetine  20 MG capsule Commonly known as: PROZAC  Take 20 mg by mouth daily.   folic acid  1 MG tablet Commonly known as: FOLVITE  Take 1 tablet (1 mg total) by mouth daily. Start taking on: February 23, 2024   gemfibrozil  600 MG tablet Commonly known as: LOPID  Take 600 mg by mouth 2 (two) times daily before a meal.   glycopyrrolate  1 MG tablet Commonly known as: ROBINUL  Take 1 mg by mouth 2 (two) times daily.   insulin  degludec 100 UNIT/ML FlexTouch Pen Commonly known as: TRESIBA Inject 40 Units into the skin daily.   lisinopril 20 MG tablet Commonly known as: ZESTRIL Take 20 mg by mouth daily.   metFORMIN 500 MG 24 hr tablet Commonly known as: GLUCOPHAGE-XR Take 1 tablet by mouth 2 (two) times daily.   MG Plus Protein 133 MG Tabs Take 1,330 mg by mouth daily.   mycophenolate  250 MG capsule Commonly known as: CELLCEPT  Take 250 mg by mouth 2 (two) times daily.   nicotine 14 mg/24hr patch Commonly known as: NICODERM CQ - dosed in mg/24 hours Place 1 patch (14 mg total) onto the skin daily as needed (tobacco dependence).    NovoLIN R 100 UNIT/ML injection Generic drug: insulin  regular Inject 20 Units into the skin 2 (two) times daily before a meal.   OLANZapine  5 MG tablet Commonly known as: ZYPREXA  Take 1 tablet by mouth at bedtime.   omeprazole 20 MG capsule Commonly known as: PRILOSEC Take 20 mg by mouth daily.   ondansetron  4 MG tablet Commonly known as: ZOFRAN  Take 4 mg by mouth every 8 (eight) hours as needed for vomiting or nausea.   prazosin 1 MG capsule Commonly known as: MINIPRESS Take 1 capsule (1 mg total) by mouth at bedtime.   Semaglutide (2 MG/DOSE) 8 MG/3ML Sopn Inject 2 mg into the skin every Monday.   sildenafil 50 MG tablet Commonly known as: VIAGRA Take 75 mg by mouth as needed.   tacrolimus  1 MG capsule Commonly known as: PROGRAF  Take 1-2 mg by mouth See admin instructions. Take 2mg  by mouth every morning and take 1mg  by mouth every evening.   thiamine  100 MG tablet Commonly known as: Vitamin B-1 Take 1 tablet (100 mg total) by mouth daily. Start taking on: February 23, 2024   traZODone  100 MG tablet Commonly known as: DESYREL  Take 200-250 mg by mouth at bedtime.        Discharge Exam:  Subjective: Still feeling depressed, wants to go to psych.  Having anxiety, sweats.   Objective: Vitals:   02/22/24 0442 02/22/24 0746  BP: (!) 124/92 132/85  Pulse: 68 89  Resp: 17 20  Temp: 98.4 F (36.9 C) 98.3 F (36.8 C)  SpO2: 95% 99%    Intake/Output Summary (Last 24 hours) at 02/22/2024 1318 Last data filed at 02/22/2024 0600 Gross per 24 hour  Intake 600 ml  Output 0 ml  Net 600 ml   Filed Weights   02/20/24 0732 02/20/24 0739  Weight: 113.4 kg 113.4 kg    Exam:  General:  Appears calm and comfortable and is in NAD Eyes:   normal lids, iris ENT:  grossly normal hearing, lips & tongue, mmm Cardiovascular:  RRR. No LE edema.  Respiratory:   CTA bilaterally with no wheezes/rales/rhonchi.  Normal respiratory effort. Abdomen:  soft, NT, ND Skin:  no rash  or induration seen on limited exam Musculoskeletal:  grossly normal tone BUE/BLE, good ROM, no bony abnormality Psychiatric: flat mood and affect, speech fluent and appropriate, AOx3 Neurologic:  CN 2-12 grossly intact, moves all extremities in coordinated fashion  Data Reviewed: I have reviewed the patient's lab results since admission.  Pertinent labs for today include:   CO2 20, improved Glucose 144 AST 103/ALT 44/Bili 1.3; 54/33/2 on 6/7 WBC 2.8, stable Platelets 79, stable    Condition at discharge: improving  The results of significant diagnostics from this hospitalization (including imaging, microbiology, ancillary and laboratory) are listed below for reference.   Imaging Studies: CT Angio Abd/Pel W and/or Wo Contrast Result Date: 02/20/2024 CLINICAL DATA:  Mesenteric ischemia EXAM: CTA ABDOMEN AND PELVIS WITHOUT AND WITH CONTRAST TECHNIQUE: Multidetector CT imaging of the abdomen and pelvis was performed using the standard protocol during bolus administration of intravenous contrast. Multiplanar reconstructed images and MIPs were obtained and reviewed to evaluate the vascular anatomy. RADIATION DOSE REDUCTION: This exam was performed according to the departmental dose-optimization program which includes automated exposure control, adjustment of the mA and/or kV according to patient size and/or use of iterative reconstruction technique. CONTRAST:  OMNIPAQUE  IOHEXOL  350 MG/ML SOLN COMPARISON:  CT abdomen June 26, 2022 November 14, 2022 FINDINGS: VASCULAR Aorta: Normal caliber aorta without aneurysm, dissection, vasculitis or significant stenosis. Celiac: Patent without evidence of aneurysm, dissection, vasculitis or significant stenosis. 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 SMA: Patent without evidence of aneurysm, dissection, vasculitis or significant stenosis. Renals: Both renal arteries are  patent without evidence of aneurysm, dissection, vasculitis, fibromuscular dysplasia or significant stenosis. IMA: Patent without evidence of aneurysm, dissection, vasculitis or significant stenosis. Inflow: Patent without evidence of aneurysm, dissection, vasculitis or significant stenosis. Proximal Outflow: Bilateral common femoral and visualized portions of the superficial and profunda femoral arteries are patent without evidence of aneurysm, dissection, vasculitis or significant stenosis. Veins: No obvious venous abnormality within the limitations of this arterial phase study. Review of the MIP images confirms the above findings. NON-VASCULAR Lower chest: No acute abnormality. Hepatobiliary: Diffuse fatty liver changes with prior cholecystectomy without hepatic lesions or hepatic biliary dilatation. Comparison with prior examination November 14, 2022 no significant change in the calcification along the anterior wall of the splenoportal confluence. Pancreas: Unremarkable. No pancreatic ductal dilatation or surrounding inflammatory changes. Spleen: Normal in size without focal abnormality. Adrenals/Urinary Tract: Stomach/Bowel: Stomach is within normal limits. Appendix appears normal. No evidence of bowel wall thickening, distention, or inflammatory changes.  Lymphatic: No significant vascular findings are present. No enlarged abdominal or pelvic lymph nodes. Reproductive: Prostate is unremarkable. Other: No abdominal wall hernia or abnormality. No abdominopelvic ascites. Musculoskeletal: No acute or significant osseous findings. IMPRESSION: VASCULAR No evidence of mesenteric ischemia. 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 NON-VASCULAR Diffuse fatty liver changes with prior cholecystectomy without hepatic lesions or hepatic biliary dilatation. No significant change in the calcification along the anterior wall of the splenoportal  confluence. Mild fatty liver changes NON-VASCULAR Mild fatty liver changes. No change in the calcifications of the splenoportal confluence compared with prior examinations. Electronically Signed   By: Fredrich Jefferson M.D.   On: 02/20/2024 13:17    Microbiology: No results found for this or any previous visit.  Labs: CBC: Recent Labs  Lab 02/20/24 0821 02/21/24 0627 02/22/24 0717  WBC 7.6 2.9* 2.8*  NEUTROABS 6.2  --  1.7  HGB 16.6 14.6 14.4  HCT 47.5 40.9 40.4  MCV 96.0 92.7 93.5  PLT 134* 83* 79*   Basic Metabolic Panel: Recent Labs  Lab 02/20/24 0821 02/20/24 1641 02/21/24 0627 02/22/24 0717  NA 134*  --  133* 137  K 4.3  --  3.5 3.5  CL 96*  --  105 106  CO2 12*  --  15* 20*  GLUCOSE 185*  --  138* 144*  BUN 24*  --  11 14  CREATININE 1.66*  --  1.03 1.01  CALCIUM  9.0  --  8.2* 8.5*  MG  --  1.5*  --   --    Liver Function Tests: Recent Labs  Lab 02/20/24 0821 02/21/24 0627 02/22/24 0717  AST 50* 54* 103*  ALT 45* 33 44  ALKPHOS 108 91 88  BILITOT 3.2* 2.0* 1.3*  PROT 7.5 6.7 6.7  ALBUMIN 4.7 3.9 3.8   CBG: Recent Labs  Lab 02/21/24 1144 02/21/24 1705 02/21/24 2137 02/22/24 0817 02/22/24 1203  GLUCAP 136* 190* 160* 195* 158*    Discharge time spent: greater than 30 minutes.  Signed: Lorita Rosa, MD [facility] 02/22/2024
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
## 2024-02-23 ENCOUNTER — Inpatient Hospital Stay (HOSPITAL_COMMUNITY)
Admission: AD | Admit: 2024-02-23 | Discharge: 2024-02-27 | DRG: 885 | Disposition: A | Discharge status: Home / Self Care | Payer: Medicare (Managed Care) | Source: Intra-hospital | Attending: Psychiatry | Admitting: Psychiatry

## 2024-02-23 ENCOUNTER — Encounter (HOSPITAL_COMMUNITY): Payer: Self-pay | Admitting: Psychiatry

## 2024-02-23 ENCOUNTER — Other Ambulatory Visit: Payer: Self-pay

## 2024-02-23 ENCOUNTER — Encounter (HOSPITAL_COMMUNITY): Payer: Self-pay

## 2024-02-23 DIAGNOSIS — Z5900 Homelessness unspecified: Secondary | ICD-10-CM

## 2024-02-23 DIAGNOSIS — I10 Essential (primary) hypertension: Secondary | ICD-10-CM | POA: Diagnosis present

## 2024-02-23 DIAGNOSIS — Z635 Disruption of family by separation and divorce: Secondary | ICD-10-CM

## 2024-02-23 DIAGNOSIS — Z7984 Long term (current) use of oral hypoglycemic drugs: Secondary | ICD-10-CM

## 2024-02-23 DIAGNOSIS — F515 Nightmare disorder: Secondary | ICD-10-CM | POA: Diagnosis present

## 2024-02-23 DIAGNOSIS — Z7901 Long term (current) use of anticoagulants: Secondary | ICD-10-CM | POA: Diagnosis not present

## 2024-02-23 DIAGNOSIS — Z5982 Transportation insecurity: Secondary | ICD-10-CM | POA: Diagnosis not present

## 2024-02-23 DIAGNOSIS — Z818 Family history of other mental and behavioral disorders: Secondary | ICD-10-CM

## 2024-02-23 DIAGNOSIS — Z5901 Sheltered homelessness: Secondary | ICD-10-CM

## 2024-02-23 DIAGNOSIS — Z630 Problems in relationship with spouse or partner: Secondary | ICD-10-CM

## 2024-02-23 DIAGNOSIS — E669 Obesity, unspecified: Secondary | ICD-10-CM | POA: Diagnosis present

## 2024-02-23 DIAGNOSIS — Z944 Liver transplant status: Secondary | ICD-10-CM

## 2024-02-23 DIAGNOSIS — Z5941 Food insecurity: Secondary | ICD-10-CM

## 2024-02-23 DIAGNOSIS — Z9151 Personal history of suicidal behavior: Secondary | ICD-10-CM | POA: Diagnosis not present

## 2024-02-23 DIAGNOSIS — F17210 Nicotine dependence, cigarettes, uncomplicated: Secondary | ICD-10-CM | POA: Diagnosis present

## 2024-02-23 DIAGNOSIS — Z79624 Long term (current) use of inhibitors of nucleotide synthesis: Secondary | ICD-10-CM | POA: Diagnosis not present

## 2024-02-23 DIAGNOSIS — Z653 Problems related to other legal circumstances: Secondary | ICD-10-CM

## 2024-02-23 DIAGNOSIS — F411 Generalized anxiety disorder: Secondary | ICD-10-CM | POA: Diagnosis present

## 2024-02-23 DIAGNOSIS — F10239 Alcohol dependence with withdrawal, unspecified: Secondary | ICD-10-CM | POA: Diagnosis not present

## 2024-02-23 DIAGNOSIS — Z79621 Long term (current) use of calcineurin inhibitor: Secondary | ICD-10-CM | POA: Diagnosis not present

## 2024-02-23 DIAGNOSIS — E139 Other specified diabetes mellitus without complications: Secondary | ICD-10-CM | POA: Diagnosis present

## 2024-02-23 DIAGNOSIS — F332 Major depressive disorder, recurrent severe without psychotic features: Secondary | ICD-10-CM | POA: Diagnosis present

## 2024-02-23 DIAGNOSIS — Z794 Long term (current) use of insulin: Secondary | ICD-10-CM | POA: Diagnosis not present

## 2024-02-23 DIAGNOSIS — Z7985 Long-term (current) use of injectable non-insulin antidiabetic drugs: Secondary | ICD-10-CM

## 2024-02-23 DIAGNOSIS — G4700 Insomnia, unspecified: Secondary | ICD-10-CM | POA: Diagnosis present

## 2024-02-23 DIAGNOSIS — Z733 Stress, not elsewhere classified: Secondary | ICD-10-CM

## 2024-02-23 DIAGNOSIS — Z825 Family history of asthma and other chronic lower respiratory diseases: Secondary | ICD-10-CM | POA: Diagnosis not present

## 2024-02-23 DIAGNOSIS — E119 Type 2 diabetes mellitus without complications: Secondary | ICD-10-CM | POA: Diagnosis present

## 2024-02-23 DIAGNOSIS — F1020 Alcohol dependence, uncomplicated: Secondary | ICD-10-CM | POA: Diagnosis present

## 2024-02-23 LAB — GLUCOSE, CAPILLARY
Glucose-Capillary: 196 mg/dL — ABNORMAL HIGH (ref 70–99)
Glucose-Capillary: 160 mg/dL — ABNORMAL HIGH (ref 70–99)
Glucose-Capillary: 198 mg/dL — ABNORMAL HIGH (ref 70–99)
Glucose-Capillary: 143 mg/dL — ABNORMAL HIGH (ref 70–99)

## 2024-02-23 LAB — TSH: TSH: 6.634 u[IU]/mL — ABNORMAL HIGH (ref 0.350–4.500)

## 2024-02-23 LAB — FOLATE: Folate: 12.4 ng/mL (ref >5.9)

## 2024-02-23 MED ORDER — PANTOPRAZOLE SODIUM 40 MG PO TBEC
40.0000 mg | DELAYED_RELEASE_TABLET | Freq: Every day | ORAL | Status: DC
  Administered 2024-02-23 – 2024-02-27 (×5): 40 mg via ORAL
  Filled 2024-02-23 (×5): qty 1

## 2024-02-23 MED ORDER — MYCOPHENOLATE MOFETIL 250 MG PO CAPS
250.0000 mg | ORAL_CAPSULE | Freq: Two times a day (BID) | ORAL | Status: DC
  Administered 2024-02-23 – 2024-02-27 (×9): 250 mg via ORAL
  Filled 2024-02-23 (×11): qty 1

## 2024-02-23 MED ORDER — ALBUTEROL SULFATE (2.5 MG/3ML) 0.083% IN NEBU: 2.5000 mg | INHALATION_SOLUTION | Freq: Four times a day (QID) | RESPIRATORY_TRACT | Status: DC | PRN

## 2024-02-23 MED ORDER — ADULT MULTIVITAMIN W/MINERALS CH
1.0000 | ORAL_TABLET | Freq: Every day | ORAL | Status: DC
  Administered 2024-02-23 – 2024-02-27 (×5): 1 via ORAL
  Filled 2024-02-23 (×5): qty 1

## 2024-02-23 MED ORDER — METFORMIN HCL 500 MG PO TABS
500.0000 mg | ORAL_TABLET | Freq: Two times a day (BID) | ORAL | Status: DC
  Administered 2024-02-23 – 2024-02-27 (×8): 500 mg via ORAL
  Filled 2024-02-23 (×8): qty 1

## 2024-02-23 MED ORDER — GEMFIBROZIL 600 MG PO TABS
600.0000 mg | ORAL_TABLET | Freq: Two times a day (BID) | ORAL | Status: DC
  Administered 2024-02-23 – 2024-02-27 (×9): 600 mg via ORAL
  Filled 2024-02-23 (×8): qty 1

## 2024-02-23 MED ORDER — LOPERAMIDE HCL 2 MG PO CAPS: 2.0000 mg | ORAL_CAPSULE | ORAL | Status: AC | PRN

## 2024-02-23 MED ORDER — TACROLIMUS 1 MG PO CAPS
2.0000 mg | ORAL_CAPSULE | Freq: Every morning | ORAL | Status: DC
  Administered 2024-02-23 – 2024-02-27 (×5): 2 mg via ORAL
  Filled 2024-02-23 (×6): qty 2

## 2024-02-23 MED ORDER — NICOTINE 14 MG/24HR TD PT24: 14.0000 mg | MEDICATED_PATCH | Freq: Every day | TRANSDERMAL | Status: DC | PRN

## 2024-02-23 MED ORDER — LORAZEPAM 2 MG/ML IJ SOLN: 0.0000 mg | Freq: Two times a day (BID) | INTRAMUSCULAR | Status: DC

## 2024-02-23 MED ORDER — PRAZOSIN HCL 1 MG PO CAPS
1.0000 mg | ORAL_CAPSULE | Freq: Every day | ORAL | Status: DC
  Administered 2024-02-23 – 2024-02-26 (×4): 1 mg via ORAL
  Filled 2024-02-23 (×4): qty 1

## 2024-02-23 MED ORDER — OLANZAPINE 5 MG PO TABS: 5.0000 mg | ORAL_TABLET | Freq: Two times a day (BID) | ORAL | Status: DC | PRN

## 2024-02-23 MED ORDER — FOLIC ACID 1 MG PO TABS
1.0000 mg | ORAL_TABLET | Freq: Every day | ORAL | Status: DC
  Administered 2024-02-23 – 2024-02-27 (×5): 1 mg via ORAL
  Filled 2024-02-23 (×5): qty 1

## 2024-02-23 MED ORDER — THIAMINE HCL 100 MG/ML IJ SOLN
100.0000 mg | Freq: Every day | INTRAMUSCULAR | Status: DC
  Filled 2024-02-23: qty 2

## 2024-02-23 MED ORDER — INSULIN ASPART 100 UNIT/ML IJ SOLN
0.0000 [IU] | Freq: Three times a day (TID) | INTRAMUSCULAR | Status: DC
  Administered 2024-02-23 – 2024-02-24 (×6): 3 [IU] via SUBCUTANEOUS
  Administered 2024-02-25: 5 [IU] via SUBCUTANEOUS
  Administered 2024-02-25 (×2): 3 [IU] via SUBCUTANEOUS
  Administered 2024-02-26 (×3): 5 [IU] via SUBCUTANEOUS
  Administered 2024-02-27: 3 [IU] via SUBCUTANEOUS
  Administered 2024-02-27: 5 [IU] via SUBCUTANEOUS

## 2024-02-23 MED ORDER — ALUM & MAG HYDROXIDE-SIMETH 200-200-20 MG/5ML PO SUSP: 30.0000 mL | ORAL | Status: DC | PRN

## 2024-02-23 MED ORDER — OLANZAPINE 10 MG PO TABS
10.0000 mg | ORAL_TABLET | Freq: Every day | ORAL | Status: DC
  Administered 2024-02-23 – 2024-02-26 (×4): 10 mg via ORAL
  Filled 2024-02-23 (×4): qty 1

## 2024-02-23 MED ORDER — INSULIN ASPART 100 UNIT/ML IJ SOLN
0.0000 [IU] | Freq: Every day | INTRAMUSCULAR | Status: DC
  Administered 2024-02-26: 0 [IU] via SUBCUTANEOUS

## 2024-02-23 MED ORDER — INSULIN GLARGINE-YFGN 100 UNIT/ML ~~LOC~~ SOLN
20.0000 [IU] | Freq: Every day | SUBCUTANEOUS | Status: DC
  Administered 2024-02-23 – 2024-02-24 (×2): 20 [IU] via SUBCUTANEOUS

## 2024-02-23 MED ORDER — LISINOPRIL 20 MG PO TABS
20.0000 mg | ORAL_TABLET | Freq: Every day | ORAL | Status: DC
  Administered 2024-02-23 – 2024-02-27 (×5): 20 mg via ORAL
  Filled 2024-02-23 (×5): qty 1

## 2024-02-23 MED ORDER — CLONAZEPAM 0.5 MG PO TABS
0.5000 mg | ORAL_TABLET | Freq: Two times a day (BID) | ORAL | Status: DC | PRN
  Administered 2024-02-23 – 2024-02-26 (×8): 0.5 mg via ORAL
  Filled 2024-02-23 (×8): qty 1

## 2024-02-23 MED ORDER — FLUOXETINE HCL 20 MG PO CAPS
40.0000 mg | ORAL_CAPSULE | Freq: Every day | ORAL | Status: DC
  Administered 2024-02-24 – 2024-02-27 (×4): 40 mg via ORAL
  Filled 2024-02-23 (×4): qty 2

## 2024-02-23 MED ORDER — OLANZAPINE 5 MG PO TABS: 5.0000 mg | ORAL_TABLET | Freq: Every day | ORAL | Status: DC

## 2024-02-23 MED ORDER — FLUOXETINE HCL 20 MG PO CAPS
20.0000 mg | ORAL_CAPSULE | Freq: Once | ORAL | Status: AC
  Administered 2024-02-23: 20 mg via ORAL
  Filled 2024-02-23: qty 1

## 2024-02-23 MED ORDER — ONDANSETRON HCL 4 MG/2ML IJ SOLN: 4.0000 mg | Freq: Four times a day (QID) | INTRAMUSCULAR | Status: DC | PRN

## 2024-02-23 MED ORDER — HYDROXYZINE HCL 25 MG PO TABS
25.0000 mg | ORAL_TABLET | Freq: Four times a day (QID) | ORAL | Status: AC | PRN
  Administered 2024-02-25 – 2024-02-26 (×2): 25 mg via ORAL
  Filled 2024-02-23 (×2): qty 1

## 2024-02-23 MED ORDER — LORAZEPAM 2 MG/ML IJ SOLN: 1.0000 mg | INTRAMUSCULAR | Status: AC | PRN

## 2024-02-23 MED ORDER — APIXABAN 5 MG PO TABS
5.0000 mg | ORAL_TABLET | Freq: Two times a day (BID) | ORAL | Status: DC
  Administered 2024-02-23 – 2024-02-27 (×9): 5 mg via ORAL
  Filled 2024-02-23 (×8): qty 1

## 2024-02-23 MED ORDER — ACETAMINOPHEN 325 MG PO TABS: 650.0000 mg | ORAL_TABLET | Freq: Four times a day (QID) | ORAL | Status: DC | PRN

## 2024-02-23 MED ORDER — AMLODIPINE BESYLATE 5 MG PO TABS
5.0000 mg | ORAL_TABLET | Freq: Every day | ORAL | Status: DC
  Administered 2024-02-23 – 2024-02-27 (×5): 5 mg via ORAL
  Filled 2024-02-23 (×5): qty 1

## 2024-02-23 MED ORDER — VITAMIN B-1 100 MG PO TABS
100.0000 mg | ORAL_TABLET | Freq: Every day | ORAL | Status: DC
  Administered 2024-02-23 – 2024-02-24 (×2): 100 mg via ORAL
  Filled 2024-02-23 (×2): qty 1

## 2024-02-23 MED ORDER — FLUOXETINE HCL 20 MG PO CAPS
20.0000 mg | ORAL_CAPSULE | Freq: Every day | ORAL | Status: DC
  Administered 2024-02-23: 20 mg via ORAL
  Filled 2024-02-23: qty 1

## 2024-02-23 MED ORDER — ACAMPROSATE CALCIUM 333 MG PO TBEC
666.0000 mg | DELAYED_RELEASE_TABLET | Freq: Three times a day (TID) | ORAL | Status: DC
  Administered 2024-02-23 – 2024-02-27 (×14): 666 mg via ORAL
  Filled 2024-02-23 (×14): qty 2

## 2024-02-23 MED ORDER — OLANZAPINE 10 MG IM SOLR: 5.0000 mg | Freq: Two times a day (BID) | INTRAMUSCULAR | Status: DC | PRN

## 2024-02-23 MED ORDER — LORAZEPAM 1 MG PO TABS
1.0000 mg | ORAL_TABLET | Freq: Four times a day (QID) | ORAL | Status: AC | PRN
  Administered 2024-02-23 – 2024-02-24 (×2): 1 mg via ORAL
  Filled 2024-02-23 (×2): qty 1

## 2024-02-23 MED ORDER — ONDANSETRON HCL 4 MG PO TABS: 4.0000 mg | ORAL_TABLET | Freq: Four times a day (QID) | ORAL | Status: DC | PRN

## 2024-02-23 MED ORDER — TACROLIMUS 1 MG PO CAPS
1.0000 mg | ORAL_CAPSULE | Freq: Every day | ORAL | Status: DC
  Administered 2024-02-23 – 2024-02-26 (×4): 1 mg via ORAL
  Filled 2024-02-23 (×5): qty 1

## 2024-02-23 MED ORDER — LORAZEPAM 1 MG PO TABS
1.0000 mg | ORAL_TABLET | ORAL | Status: AC | PRN
  Administered 2024-02-23 (×2): 2 mg via ORAL
  Filled 2024-02-23 (×2): qty 2

## 2024-02-23 MED ORDER — GLYCOPYRROLATE 1 MG PO TABS
1.0000 mg | ORAL_TABLET | Freq: Two times a day (BID) | ORAL | Status: DC
  Administered 2024-02-23 – 2024-02-27 (×9): 1 mg via ORAL
  Filled 2024-02-23 (×11): qty 1

## 2024-02-23 MED ORDER — MAGNESIUM HYDROXIDE 400 MG/5ML PO SUSP: 30.0000 mL | Freq: Every day | ORAL | Status: DC | PRN

## 2024-02-23 MED ORDER — TRAZODONE HCL 100 MG PO TABS
200.0000 mg | ORAL_TABLET | Freq: Every day | ORAL | Status: DC
  Administered 2024-02-23 – 2024-02-26 (×4): 200 mg via ORAL
  Filled 2024-02-23 (×4): qty 2

## 2024-02-23 NOTE — [facility] Suicide Risk Assessment (Signed)
 Suicide Risk Assessment  Admission Assessment    [facility] Admission Suicide Risk Assessment   Nursing information obtained from:  Patient Demographic factors:  Male, Divorced or widowed, Caucasian Current Mental Status:  Suicidal ideation indicated by others, Self-harm thoughts, Self-harm behaviors Loss Factors:  Loss of significant relationship Historical Factors:  Impulsivity Risk Reduction Factors:  Sense of responsibility to family  Total Time spent with patient: 30 minutes Principal Problem: Major depressive disorder, recurrent severe without psychotic features (HCC) Diagnosis:  Principal Problem:   Major depressive disorder, recurrent severe without psychotic features (HCC)  Subjective Data: See H&P   Continued Clinical Symptoms:    The "Alcohol Use Disorders Identification Test", Guidelines for Use in Primary Care, Second Edition.  World Science writer [facility]). Score between 0-7:  no or low risk or alcohol related problems. Score between 8-15:  moderate risk of alcohol related problems. Score between 16-19:  high risk of alcohol related problems. Score 20 or above:  warrants further diagnostic evaluation for alcohol dependence and treatment.   CLINICAL FACTORS:   Panic Attacks Depression:   Anhedonia Comorbid alcohol abuse/dependence Hopelessness Impulsivity Insomnia Severe Alcohol/Substance Abuse/Dependencies More than one psychiatric diagnosis Previous Psychiatric Diagnoses and Treatments Medical Diagnoses and Treatments/Surgeries   Musculoskeletal: Strength & Muscle Tone: within normal limits Gait & Station: normal Patient leans: N/A  Psychiatric Specialty Exam:  Presentation  General Appearance: Casual; Fairly Groomed  Eye Contact:Good  Speech:Clear and Coherent; Normal Rate  Speech Volume:Normal  Handedness:No data recorded  Mood and Affect  Mood:Depressed; Hopeless; Worthless  Affect:Congruent; Depressed; Tearful   Thought Process   Thought Processes:Coherent; Goal Directed  Descriptions of Associations:Loose  Orientation:Full (Time, Place and Person)  Thought Content:Logical  History of Schizophrenia/Schizoaffective disorder:No data recorded Duration of Psychotic Symptoms:No data recorded Hallucinations:Hallucinations: None  Ideas of Reference:None  Suicidal Thoughts:Suicidal Thoughts: No  Homicidal Thoughts:Homicidal Thoughts: No   Sensorium  Memory:Immediate Good; Recent Good  Judgment:Fair  Insight:No data recorded  Executive Functions  Concentration:Fair  Attention Span:Fair  Recall:Good  Fund of Knowledge:Fair  Language:Fair   Psychomotor Activity  Psychomotor Activity:Psychomotor Activity: Normal   Assets  Assets:Resilience   Sleep  Sleep:Sleep: Poor    Physical Exam: Physical Exam See H&P  ROS See H&P  Blood pressure (!) 130/90, pulse 86, temperature 98 F (36.7 C), temperature source Oral, resp. rate 18, height 5\' 11"  (1.803 m), weight 112 kg, SpO2 99%. Body mass index is 34.45 kg/m.   COGNITIVE FEATURES THAT CONTRIBUTE TO RISK:  None    SUICIDE RISK:   Moderate:  Frequent suicidal ideation with limited intensity, and duration, some specificity in terms of plans, no associated intent, good self-control, limited dysphoria/symptomatology, some risk factors present, and identifiable protective factors, including available and accessible social support.  PLAN OF CARE: See H&P   I certify that inpatient services furnished can reasonably be expected to improve the patient's condition.   Lennard Quirk, NP 02/23/2024, 12:41 PM

## 2024-02-23 NOTE — Plan of Care (Signed)
   Problem: Education: Goal: Knowledge of [place] [facility] Education information/materials will improve Outcome: Progressing

## 2024-02-23 NOTE — Progress Notes (Signed)
 Pt is actively detoxing and 1245 CIWA was 13. Pt c/o insulin  too low of a dose for long acting and sliding scale. Notified NP Bennett.    02/23/24 1000  Psych Admission Type (Psych Patients Only)  Admission Status Voluntary  Psychosocial Assessment  Patient Complaints Crying spells;Depression;Anxiety (Pt was tearful and stated, "my life's a mess." He endorsed Passive SI that if he didn't wake up it would be good for him.  Pt contracted for safety on the unit. He denied SI plan/HI/AVH)  Eye Contact Darting  Facial Expression Sad  Affect Anxious;Depressed  Speech Logical/coherent;Other (Comment) (tearful)  Interaction Assertive  Motor Activity Restless  Appearance/Hygiene Unremarkable  Behavior Characteristics Cooperative  Mood Depressed;Anxious  Thought Process  Coherency WDL  Content WDL  Delusions None reported or observed  Perception WDL  Hallucination None reported or observed  Judgment Impaired  Confusion None  Danger to Self  Current suicidal ideation? Passive  Self-Injurious Behavior No self-injurious ideation or behavior indicators observed or expressed   Agreement Not to Harm Self Yes  Description of Agreement Verbally contracted for safety  Danger to Others  Danger to Others None reported or observed

## 2024-02-23 NOTE — Plan of Care (Signed)

## 2024-02-23 NOTE — [facility] Group Notes (Addendum)
Pt did attend AA group  

## 2024-02-23 NOTE — BH IP Treatment Plan (Signed)
 Interdisciplinary Treatment and Diagnostic Plan Update  02/23/2024 Time of Session: 1051 Steve Krueger MRN: [id number]  Principal Diagnosis: Major depressive disorder, recurrent severe without psychotic features (HCC)  Secondary Diagnoses: Principal Problem:   Major depressive disorder, recurrent severe without psychotic features (HCC)   Current Medications:  Current Facility-Administered Medications  Medication Dose Route Frequency Provider Last Rate Last Admin   acamprosate  (CAMPRAL ) tablet 666 mg  666 mg Oral TID WC Lissa Riding, NP   666 mg at 02/23/24 4098   acetaminophen  (TYLENOL ) tablet 650 mg  650 mg Oral Q6H PRN Lissa Riding, NP       albuterol  (PROVENTIL ) (2.5 MG/3ML) 0.083% nebulizer solution 2.5 mg  2.5 mg Nebulization Q6H PRN Lord, Jamison Y, NP       alum & mag hydroxide-simeth (MAALOX/MYLANTA) 200-200-20 MG/5ML suspension 30 mL  30 mL Oral Q4H PRN Lord, Jamison Y, NP       amLODipine  (NORVASC ) tablet 5 mg  5 mg Oral Daily Lord, Jamison Y, NP   5 mg at 02/23/24 1191   apixaban  (ELIQUIS ) tablet 5 mg  5 mg Oral BID Lissa Riding, NP   5 mg at 02/23/24 4782   clonazePAM  (KLONOPIN ) tablet 0.5 mg  0.5 mg Oral BID PRN Lissa Riding, NP   0.5 mg at 02/23/24 9562   FLUoxetine  (PROZAC ) capsule 20 mg  20 mg Oral Daily Lord, Jamison Y, NP   20 mg at 02/23/24 1308   folic acid  (FOLVITE ) tablet 1 mg  1 mg Oral Daily Lord, Jamison Y, NP   1 mg at 02/23/24 6578   gemfibrozil  (LOPID ) tablet 600 mg  600 mg Oral BID AC Lord, Jamison Y, NP   600 mg at 02/23/24 4696   glycopyrrolate  (ROBINUL ) tablet 1 mg  1 mg Oral BID Lissa Riding, NP       insulin  aspart (novoLOG ) injection 0-15 Units  0-15 Units Subcutaneous TID WC Lissa Riding, NP   3 Units at 02/23/24 2952   insulin  aspart (novoLOG ) injection 0-5 Units  0-5 Units Subcutaneous QHS Lissa Riding, NP       insulin  glargine-yfgn (SEMGLEE ) injection 20 Units  20 Units Subcutaneous Q2000 Lissa Riding, NP       LORazepam   (ATIVAN ) injection 0-4 mg  0-4 mg Intravenous Q12H Lissa Riding, NP       LORazepam  (ATIVAN ) tablet 1-4 mg  1-4 mg Oral Q1H PRN Lord, Jamison Y, NP   2 mg at 02/23/24 8413   Or   LORazepam  (ATIVAN ) injection 1-4 mg  1-4 mg Intravenous Q1H PRN Lord, Jamison Y, NP       magnesium  hydroxide (MILK OF MAGNESIA) suspension 30 mL  30 mL Oral Daily PRN Lissa Riding, NP       multivitamin with minerals tablet 1 tablet  1 tablet Oral Daily Lord, Jamison Y, NP   1 tablet at 02/23/24 2440   mycophenolate  (CELLCEPT ) capsule 250 mg  250 mg Oral BID Lord, Jamison Y, NP   250 mg at 02/23/24 1027   nicotine (NICODERM CQ - dosed in mg/24 hours) patch 14 mg  14 mg Transdermal Daily PRN Lord, Jamison Y, NP       OLANZapine  (ZYPREXA ) tablet 5 mg  5 mg Oral BID PRN Lissa Riding, NP       Or   OLANZapine  (ZYPREXA ) injection 5 mg  5 mg Intramuscular BID PRN Lissa Riding, NP  OLANZapine  (ZYPREXA ) tablet 5 mg  5 mg Oral QHS Lord, Jamison Y, NP       ondansetron  (ZOFRAN ) tablet 4 mg  4 mg Oral Q6H PRN Lissa Riding, NP       Or   ondansetron  (ZOFRAN ) injection 4 mg  4 mg Intravenous Q6H PRN Lissa Riding, NP       pantoprazole  (PROTONIX ) EC tablet 40 mg  40 mg Oral Daily Lissa Riding, NP   40 mg at 02/23/24 1191   prazosin (MINIPRESS) capsule 1 mg  1 mg Oral QHS Lissa Riding, NP       tacrolimus  (PROGRAF ) capsule 2 mg  2 mg Oral q morning Lissa Riding, NP   2 mg at 02/23/24 4782   And   tacrolimus  (PROGRAF ) capsule 1 mg  1 mg Oral QHS Lissa Riding, NP       thiamine  (Vitamin B-1) tablet 100 mg  100 mg Oral Daily Lissa Riding, NP   100 mg at 02/23/24 9562   Or   thiamine  (VITAMIN B1) injection 100 mg  100 mg Intravenous Daily Lissa Riding, NP       traZODone  (DESYREL ) tablet 200 mg  200 mg Oral QHS Lord, Jamison Y, NP       PTA Medications: Medications Prior to Admission  Medication Sig Dispense Refill Last Dose/Taking   acamprosate  (CAMPRAL ) 333 MG tablet Take 666 mg by mouth  3 (three) times daily with meals.      amLODipine  (NORVASC ) 5 MG tablet Take 5 mg by mouth daily.      apixaban  (ELIQUIS ) 5 MG TABS tablet Take 5 mg by mouth 2 (two) times daily.      clonazePAM  (KLONOPIN ) 0.5 MG tablet Take 1 tablet by mouth 2 (two) times daily as needed.      FLUoxetine  (PROZAC ) 20 MG capsule Take 20 mg by mouth daily.      folic acid  (FOLVITE ) 1 MG tablet Take 1 tablet (1 mg total) by mouth daily.      gemfibrozil  (LOPID ) 600 MG tablet Take 600 mg by mouth 2 (two) times daily before a meal.      glycopyrrolate  (ROBINUL ) 1 MG tablet Take 1 mg by mouth 2 (two) times daily.      insulin  degludec (TRESIBA) 100 UNIT/ML FlexTouch Pen Inject 40 Units into the skin daily.      lisinopril (ZESTRIL) 20 MG tablet Take 20 mg by mouth daily.      metFORMIN (GLUCOPHAGE-XR) 500 MG 24 hr tablet Take 1 tablet by mouth 2 (two) times daily.      mycophenolate  (CELLCEPT ) 250 MG capsule Take 250 mg by mouth 2 (two) times daily.      nicotine (NICODERM CQ - DOSED IN MG/24 HOURS) 14 mg/24hr patch Place 1 patch (14 mg total) onto the skin daily as needed (tobacco dependence).      NOVOLIN R 100 UNIT/ML injection Inject 20 Units into the skin 2 (two) times daily before a meal.      OLANZapine  (ZYPREXA ) 5 MG tablet Take 1 tablet by mouth at bedtime.      omeprazole (PRILOSEC) 20 MG capsule Take 20 mg by mouth daily.      ondansetron  (ZOFRAN ) 4 MG tablet Take 4 mg by mouth every 8 (eight) hours as needed for vomiting or nausea.      prazosin (MINIPRESS) 1 MG capsule Take 1 capsule (1 mg total) by mouth at bedtime.  Semaglutide, 2 MG/DOSE, 8 MG/3ML SOPN Inject 2 mg into the skin every Monday.      sildenafil (VIAGRA) 50 MG tablet Take 75 mg by mouth as needed.      Specialty Vitamins Products (MG PLUS PROTEIN) 133 MG TABS Take 1,330 mg by mouth daily.      tacrolimus  (PROGRAF ) 1 MG capsule Take 1-2 mg by mouth See admin instructions. Take 2mg  by mouth every morning and take 1mg  by mouth every  evening.      thiamine  (VITAMIN B-1) 100 MG tablet Take 1 tablet (100 mg total) by mouth daily.      traZODone  (DESYREL ) 100 MG tablet Take 200-250 mg by mouth at bedtime.       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   Physician Treatment Plan for Primary Diagnosis: Major depressive disorder, recurrent severe without psychotic features (HCC) Long Term Goal(s):     Short Term Goals:    Medication Management: Evaluate patient's response, side effects, and tolerance of medication regimen.  Therapeutic Interventions: 1 to 1 sessions, Unit Group sessions and Medication administration.  Evaluation of Outcomes: Not Progressing  Physician Treatment Plan for Secondary Diagnosis: Principal Problem:   Major depressive disorder, recurrent severe without psychotic features (HCC)  Long Term Goal(s):     Short Term Goals:       Medication Management: Evaluate patient's response, side effects, and tolerance of medication regimen.  Therapeutic Interventions: 1 to 1 sessions, Unit Group sessions and Medication administration.  Evaluation of Outcomes: Not Progressing   RN Treatment Plan for Primary Diagnosis: Major depressive disorder, recurrent severe without psychotic features (HCC) Long Term Goal(s): Knowledge of disease and therapeutic regimen to maintain health will improve  Short Term Goals: Ability to remain free from injury will improve, Ability to verbalize frustration and anger appropriately will improve, Ability to demonstrate self-control, Ability to verbalize feelings will improve, Ability to identify and develop effective coping behaviors will  improve, and Compliance with prescribed medications will improve  Medication Management: RN will administer medications as ordered by provider, will assess and evaluate patient's response and provide education to patient for prescribed medication. RN will report any adverse and/or side effects to prescribing provider.  Therapeutic Interventions: 1 on 1 counseling sessions, Psychoeducation, Medication administration, Evaluate responses to treatment, Monitor vital signs and CBGs as ordered, Perform/monitor CIWA, COWS, AIMS and Fall Risk screenings as ordered, Perform wound care treatments as ordered.  Evaluation of Outcomes: Not Progressing   LCSW Treatment Plan for Primary Diagnosis: Major depressive disorder, recurrent severe without psychotic features (HCC) Long Term Goal(s): Safe transition to appropriate next level of care at discharge, Engage patient in therapeutic group addressing interpersonal concerns.  Short Term Goals: Engage patient in aftercare planning with referrals and resources, Increase social support, Increase ability to appropriately verbalize feelings, Increase emotional regulation, Identify triggers associated with mental health/substance abuse issues, and Increase skills for wellness and recovery  Therapeutic Interventions: Assess for all discharge needs, 1 to 1 time with Social worker, Explore available resources and support systems, Assess for adequacy in community support network, Educate family and significant other(s) on suicide prevention, Complete Psychosocial Assessment, Interpersonal group therapy.  Evaluation of Outcomes: Not Progressing   Progress in  Treatment: Attending groups: No. Participating in groups: No. and As evidenced by:  admitted for >24 hours Taking medication as prescribed: Yes. Toleration medication: Yes. Family/Significant other contact made: No, will contact:  PSA / consents pending Patient understands diagnosis: Yes. Discussing patient  identified problems/goals with staff: Yes. Medical problems stabilized or resolved: Yes. Denies suicidal/homicidal ideation: Yes. Issues/concerns per patient self-inventory: No. Other: None  New problem(s) identified: No, Describe:  n/a  New Short Term/Long Term Goal(s): detox, medication management for mood stabilization; elimination of SI thoughts; development of comprehensive mental wellness/sobriety plan  Patient Goals:  "I want to find a way to beat this depression"  Discharge Plan or Barriers: Patient recently admitted. CSW will continue to follow and assess for appropriate referrals and possible discharge planning.    Reason for Continuation of Hospitalization: Medication stabilization Withdrawal symptoms Other; describe mood stabilization, discharge planning  Estimated Length of Stay: 3-5 DAYS  Last 3 [place] Suicide Severity Risk Score: Flowsheet Row Admission (Current) [facility]-Admission (Discharged) from 02/20/2024 in [facility] KIDNEY UNIT  C-SSRS RISK CATEGORY No Risk No Risk       Last PHQ 2/9 Scores:     No data to display          Scribe for Treatment Team: Jaxsin Bottomley N Renad Jenniges, LCSW 02/23/2024 10:19 AM

## 2024-02-23 NOTE — Inpatient Diabetes Management (Signed)
 Inpatient Diabetes Program Recommendations  AACE/ADA: New Consensus Statement on Inpatient Glycemic Control   Target Ranges:  Prepandial:   less than 140 mg/dL      Peak postprandial:   less than 180 mg/dL (1-2 hours)      Critically ill patients:  140 - 180 mg/dL    Latest Reference Range & Units 02/23/24 05:08 02/23/24 12:05  Glucose-Capillary 70 - 99 mg/dL 960 (H) 454 (H)    Latest Reference Range & Units 02/22/24 08:17 02/22/24 12:03 02/22/24 16:05 02/22/24 21:12  Glucose-Capillary 70 - 99 mg/dL 098 (H) 119 (H) 147 (H) 157 (H)    Latest Reference Range & Units 02/21/24 07:39 02/21/24 11:44 02/21/24 17:05 02/21/24 21:37  Glucose-Capillary 70 - 99 mg/dL 829 (H) 562 (H) 130 (H) 160 (H)   Review of Glycemic Control  Diabetes history: DM2 Outpatient Diabetes medications: Tresiba 40 units daily, Metformin XR 500 mg BID, Ozempic 2 mg Qweek (Monday) Current orders for Inpatient glycemic control: Semglee  20 units daily, Novolog  0-15 units TID with meals, Novolog  0-5 units at bedtime, Metformin 500 mg BID  Inpatient Diabetes Program Recommendations:    Agree with inpatient DM medications at this time.  NOTE: Patient admitted to inpatient behavior health on 02/22/24. Agree with current orders for DM control. Will continue to follow along and make recommendations if needed.   Thanks, Beacher Limerick, RN, MSN, CDCES Diabetes Coordinator Inpatient Diabetes Program [phone] (Team Pager from 8am to 5pm)
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
## 2024-02-23 NOTE — Plan of Care (Signed)
  Problem: Education: Goal: Knowledge of [facility] Education information/materials will improve 02/23/2024 2344 by Juanda Noon, RN Outcome: Progressing 02/23/2024 2344 by Juanda Noon, RN Outcome: Progressing Goal: Emotional status will improve 02/23/2024 2344 by Juanda Noon, RN Outcome: Progressing 02/23/2024 2344 by Juanda Noon, RN Outcome: Progressing Goal: Mental status will improve 02/23/2024 2344 by Juanda Noon, RN Outcome: Progressing 02/23/2024 2344 by Juanda Noon, RN Outcome: Progressing Goal: Verbalization of understanding the information provided will improve 02/23/2024 2344 by Juanda Noon, RN Outcome: Progressing 02/23/2024 2344 by Juanda Noon, RN Outcome: Progressing   Problem: Activity: Goal: Interest or engagement in activities will improve Outcome: Progressing

## 2024-02-23 NOTE — Progress Notes (Signed)
   02/23/24 2300  Psych Admission Type (Psych Patients Only)  Admission Status Voluntary  Psychosocial Assessment  Patient Complaints Anxiety;Depression  Eye Contact Fair  Facial Expression Anxious  Affect Depressed  Speech Logical/coherent  Interaction Assertive  Motor Activity Other (Comment) (WNL)  Appearance/Hygiene Unremarkable  Behavior Characteristics Cooperative;Appropriate to situation  Mood Anxious  Thought Process  Coherency WDL  Content WDL  Delusions None reported or observed  Perception WDL  Hallucination None reported or observed  Judgment Limited  Confusion None  Danger to Self  Current suicidal ideation? Denies  Self-Injurious Behavior No self-injurious ideation or behavior indicators observed or expressed   Agreement Not to Harm Self Yes  Description of Agreement Verbal contract for safety  Danger to Others  Danger to Others None reported or observed

## 2024-02-23 NOTE — Group Note (Signed)
 Date:  02/23/2024 Time:  9:02 AM  Group Topic/Focus:  Goals Group:   The focus of this group is to help patients establish daily goals to achieve during treatment and discuss how the patient can incorporate goal setting into their daily lives to aide in recovery.    Participation Level:  Did Not Attend   Steve Krueger 02/23/2024, 9:02 AM

## 2024-02-23 NOTE — Group Note (Signed)
 Recreation Therapy Group Note   Group Topic:Stress Management  Group Date: 02/23/2024 Start Time: 0932 End Time: 0958 Facilitators: Takiyah Bohnsack-McCall, LRT,CTRS Location: [address] Dayroom   Group Topic: Stress Management  Goal Area(s) Addresses:  Patient will identify positive stress management techniques. Patient will identify benefits of using stress management post d/c.  Behavioral Response:   Intervention: Calm App  Activity: Meditation. LRT played a meditation for patients called the [facility] meditation. This meditation focused on taking the strength and resilience that mountains represent and bringing them into your daily life.    Education:  Stress Management, Discharge Planning.   Education Outcome: Acknowledges Education   Affect/Mood: N/A   Participation Level: Did not attend    Clinical Observations/Individualized Feedback:     Plan: Continue to engage patient in RT group sessions 2-3x/week.   Steve Krueger, LRT,CTRS 02/23/2024 12:38 PM

## 2024-02-23 NOTE — Tx Team (Signed)
 Initial Treatment Plan 02/23/2024 5:40 AM Steve Krueger ZOX:[id number]    PATIENT STRESSORS: Financial difficulties   Marital or family conflict   Substance abuse   Traumatic event     PATIENT STRENGTHS: Average or above average intelligence  Communication skills  Motivation for treatment/growth    PATIENT IDENTIFIED PROBLEMS: Depression  Suicidal Ideation (denies now)  Anxiety  Substance abuse (alcohol)        "Getting depression under control"       DISCHARGE CRITERIA:  Improved stabilization in mood, thinking, and/or behavior Motivation to continue treatment in a less acute level of care Need for constant or close observation no longer present Verbal commitment to aftercare and medication compliance Withdrawal symptoms are absent or subacute and managed without 24-hour nursing intervention  PRELIMINARY DISCHARGE PLAN: Attend aftercare/continuing care group Attend PHP/IOP Attend 12-step recovery group Outpatient therapy Placement in alternative living arrangements  PATIENT/FAMILY INVOLVEMENT: This treatment plan has been presented to and reviewed with the patient, Steve D Gaskill..  The patient and family have been given the opportunity to ask questions and make suggestions.  Delaney Fearing, RN 02/23/2024, 5:40 AM

## 2024-02-23 NOTE — Progress Notes (Signed)
 Pt is a 48 year old Caucasian  male admitted to for three day alcohol binge as well as suicidal ideation over breakup with GF of three years.  Pt is homeless and has been in motel binge drinking vodka.  Pt is type 2 Diabetic.  Pt was cooperative with the admission process, appeared very anxious and tremulous.  Pt scored at 13 on CIWA and was medicated accordingly.     02/23/24 0524  Psych Admission Type (Psych Patients Only)  Admission Status Voluntary  Psychosocial Assessment  Patient Complaints Anxiety;Substance abuse  Eye Contact Fair  Facial Expression Sad  Affect Appropriate to circumstance  Speech Logical/coherent  Interaction Assertive  Motor Activity Fidgety  Appearance/Hygiene Unremarkable  Behavior Characteristics Appropriate to situation  Mood Depressed  Thought Process  Coherency WDL  Content WDL  Delusions None reported or observed  Perception WDL  Hallucination None reported or observed  Judgment Poor  Confusion None  Danger to Self  Current suicidal ideation? Denies  Agreement Not to Harm Self Yes  Description of Agreement verbal  Danger to Others  Danger to Others None reported or observed

## 2024-02-23 NOTE — Progress Notes (Signed)
 Report given to Paola Bohr at [facility].  Patient and all personal belongings sent with patient via Psychologist, educational.  Necia Bali RN

## 2024-02-23 NOTE — Group Note (Signed)
 Occupational Therapy Group Note  Group Topic: Sleep Hygiene  Group Date: 02/23/2024 Start Time: 1430 End Time: 1509 Facilitators: Lynnda Sas, OT   Group Description: Group encouraged increased participation and engagement through topic focused on sleep hygiene. Patients reflected on the quality of sleep they typically receive and identified areas that need improvement. Group was given background information on sleep and sleep hygiene, including common sleep disorders. Group members also received information on how to improve one's sleep and introduced a sleep diary as a tool that can be utilized to track sleep quality over a length of time. Group session ended with patients identifying one or more strategies they could utilize or implement into their sleep routine in order to improve overall sleep quality.        Therapeutic Goal(s):  Identify one or more strategies to improve overall sleep hygiene  Identify one or more areas of sleep that are negatively impacted (sleep too much, too little, etc)     Participation Level: Engaged   Participation Quality: Independent   Behavior: Appropriate   Speech/Thought Process: Relevant   Affect/Mood: Appropriate   Insight: Fair   Judgement: Fair      Modes of Intervention: Education  Patient Response to Interventions:  Attentive   Plan: Continue to engage patient in OT groups 2 - 3x/week.  02/23/2024  Lynnda Sas, OT  Ezra Denne, OT

## 2024-02-24 DIAGNOSIS — F332 Major depressive disorder, recurrent severe without psychotic features: Secondary | ICD-10-CM

## 2024-02-24 LAB — GLUCOSE, CAPILLARY
Glucose-Capillary: 176 mg/dL — ABNORMAL HIGH (ref 70–99)
Glucose-Capillary: 160 mg/dL — ABNORMAL HIGH (ref 70–99)
Glucose-Capillary: 185 mg/dL — ABNORMAL HIGH (ref 70–99)
Glucose-Capillary: 178 mg/dL — ABNORMAL HIGH (ref 70–99)

## 2024-02-24 LAB — VITAMIN D 25 HYDROXY (VIT D DEFICIENCY, FRACTURES): Vit D, 25-Hydroxy: 38.53 ng/mL (ref 30–100)

## 2024-02-24 LAB — RPR: RPR Ser Ql: NONREACTIVE

## 2024-02-24 MED ORDER — VITAMIN B-1 100 MG PO TABS
100.0000 mg | ORAL_TABLET | Freq: Every day | ORAL | Status: DC
  Administered 2024-02-25 – 2024-02-27 (×3): 100 mg via ORAL
  Filled 2024-02-24 (×3): qty 1

## 2024-02-24 MED ORDER — THIAMINE HCL 100 MG/ML IJ SOLN: 100.0000 mg | Freq: Every day | INTRAMUSCULAR | Status: DC

## 2024-02-24 NOTE — Group Note (Signed)
 LCSW Group Therapy Note   Group Date: 02/24/2024 Start Time: 1100 End Time: 1200   Participation:  patient was present  Type of Therapy:  Group Therapy  Topic:  Speaking from the Heart: Communicating with Understanding and Empathy  Objective:  To help participants develop effective communication skills to express themselves clearly, listen actively, and navigate conflicts in a healthy way.  Goals: Increase awareness of verbal and non-verbal communication skills. Practice using "I" statements and active listening techniques. Learn coping strategies for managing communication stress.  Summary:  Participants explored the importance of communication, discussed challenges, and practiced skills such as active listening and assertive expression. They reflected on past experiences and identified ways to improve communication in their daily lives.  Therapeutic Modalities: Cognitive-Behavioral Therapy (CBT): Restructuring negative thought patterns in communication. Mindfulness: Staying present and calm during conversations. Psychoeducation: Learning about effective communication techniques.   Mishon Blubaugh O Jabrea Kallstrom, LCSWA 02/24/2024  5:42 PM

## 2024-02-24 NOTE — [facility] Group Notes (Signed)
 [facility] Group Notes:  (Nursing/MHT/Case Management/Adjunct)  Date:  02/24/2024  Time:  2020  Type of Therapy:  Wrap up group  Participation Level:  Active  Participation Quality:  Appropriate, Attentive, Sharing, and Supportive  Affect:  Appropriate  Cognitive:  Alert  Insight:  Improving  Engagement in Group:  Engaged  Modes of Intervention:  Clarification, Education, and Support  Summary of Progress/Problems: Positive thinking and self-care were discussed.   Catharine Clock 02/24/2024, 9:17 PM

## 2024-02-24 NOTE — Group Note (Signed)
 Date:  02/24/2024 Time:  8:51 AM  Group Topic/Focus:  Goals Group:   The focus of this group is to help patients establish daily goals to achieve during treatment and discuss how the patient can incorporate goal setting into their daily lives to aide in recovery. Orientation:   The focus of this group is to educate the patient on the purpose and policies of crisis stabilization and provide a format to answer questions about their admission.  The group details unit policies and expectations of patients while admitted.    Participation Level:  Did Not Attend

## 2024-02-24 NOTE — Group Note (Signed)
 Recreation Therapy Group Note   Group Topic:Animal Assisted Therapy   Group Date: 02/24/2024 Start Time: 0946 End Time: 1030 Facilitators: Tedra Coppernoll-McCall, LRT,CTRS Location: [address] Dayroom   Animal-Assisted Activity (AAA) Program Checklist/Progress Notes Patient Eligibility Criteria Checklist & Daily Group note for Rec Tx Intervention  AAA/T Program Assumption of Risk Form signed by Patient/ or Parent Legal Guardian Yes  Patient is free of allergies or severe asthma Yes  Patient reports no fear of animals Yes  Patient reports no history of cruelty to animals Yes  Patient understands his/her participation is voluntary Yes  Patient washes hands before animal contact Yes  Patient washes hands after animal contact Yes  Behavioral Response: Engaged   Education: Charity fundraiser, Appropriate Animal Interaction   Education Outcome: Acknowledges education.    Affect/Mood: Appropriate   Participation Level: Engaged   Participation Quality: Independent   Behavior: Appropriate   Speech/Thought Process: Focused   Insight: Good   Judgement: Good   Modes of Intervention: Teaching laboratory technician   Patient Response to Interventions:  Engaged   Education Outcome:  In group clarification offered    Clinical Observations/Individualized Feedback:  Patient attended session and interacted appropriately with therapy dog and peers. Patient asked appropriate questions about therapy dog and his training. Patient shared stories about their pets at home with group.   Plan: Continue to engage patient in RT group sessions 2-3x/week.   Kentavius Dettore-McCall, LRT,CTRS 02/24/2024 12:30 PM

## 2024-02-24 NOTE — Progress Notes (Signed)
   02/24/24 1500  Psych Admission Type (Psych Patients Only)  Admission Status Voluntary  Psychosocial Assessment  Patient Complaints Anxiety;Crying spells;Depression  Eye Contact Fair  Facial Expression Sad  Affect Depressed;Sad  Speech Logical/coherent  Interaction Assertive  Appearance/Hygiene Unremarkable  Behavior Characteristics Cooperative  Mood Anxious  Thought Process  Coherency WDL  Content WDL  Delusions None reported or observed  Perception WDL  Hallucination None reported or observed  Judgment Limited  Confusion None  Danger to Self  Current suicidal ideation? Denies  Self-Injurious Behavior No self-injurious ideation or behavior indicators observed or expressed   Agreement Not to Harm Self Yes  Description of Agreement Verbal  Danger to Others  Danger to Others None reported or observed

## 2024-02-25 DIAGNOSIS — F332 Major depressive disorder, recurrent severe without psychotic features: Secondary | ICD-10-CM | POA: Diagnosis not present

## 2024-02-25 LAB — GLUCOSE, CAPILLARY
Glucose-Capillary: 184 mg/dL — ABNORMAL HIGH (ref 70–99)
Glucose-Capillary: 187 mg/dL — ABNORMAL HIGH (ref 70–99)
Glucose-Capillary: 223 mg/dL — ABNORMAL HIGH (ref 70–99)
Glucose-Capillary: 177 mg/dL — ABNORMAL HIGH (ref 70–99)

## 2024-02-25 LAB — T4, FREE: Free T4: 0.86 ng/dL (ref 0.61–1.12)

## 2024-02-25 MED ORDER — INSULIN GLARGINE-YFGN 100 UNIT/ML ~~LOC~~ SOLN
22.0000 [IU] | Freq: Every day | SUBCUTANEOUS | Status: DC
  Administered 2024-02-25 – 2024-02-26 (×2): 22 [IU] via SUBCUTANEOUS

## 2024-02-25 NOTE — Progress Notes (Signed)
 [facility] MD Progress Note  02/25/2024 3:34 PM Steve Krueger  MRN:  [id number]  Reason for Admission: Steve Krueger. Steve Krueger presented to [facility] on February 20, 2024, reporting a 3-day binge of vodka use and experiencing depressive symptoms following a recent break-up with his fiance.  He endorsed suicidal ideation at the time of presentation and describes a past suicide attempt approximately 4 years ago involving an insulin  overdose.  Following medical clearance, the patient was admitted [facility] 23, 2024 for psychiatric treatment and stabilization.  Daily Notes: Steve Krueger is seen. Chart reviewed. The chart findings discussed with the treatment team. He presents alert, oriented x 3 & aware of situation. He is visible on the unit, attending group sessions. He presents tearful/emotional. He reports, This is my third day in this hospital.I have not seen the SW yet & I need to him or her. I'm here because I feel so depressed & it has worsened in the last one week. I have been on mental health medicines most of my life. Mental illness runs big in my family. My mother has manic depression, she ended-up killing herself. My brother also killed himself. Both my mother &  my brother killed themselves around the age I'm now. The reason for my depression is because my fiancee left me. We have been together for 3 years, but her parents did not want me to marry her. So, I had a fight with her parents which led to us  having a fight (My fiance & me). I got mad & slammed the door & she kicked me out of the house. Now, I'm homeless.  I have been feeling distraught & I have been constantly crying since this happened. I'm not really suicidal, rather, I'm feeling like I do not want to be in this world any more. Steve Krueger currently denies any HI, AVH, delusional thoughts or paranoia. He does not appear to be responding to any internal stimuli. Patient continues to attend group sessions. It appears  that patient's main concerns now is a place to stay after discharge. Patient remains on his current plan of care as already in progress. He started his Prozac  40 mg today. Reviewed, vital signs stable. Rates depression #8 & anxiety #7. This case was discussed with the treatment team.  Principal Problem: Major depressive disorder, recurrent severe without psychotic features (HCC) Diagnosis: Principal Problem:   Major depressive disorder, recurrent severe without psychotic features (HCC) Active Problems:   Post-transplant diabetes mellitus (HCC)   S/P liver transplant (HCC)   Hypertension  Total Time spent with patient: 35 minutes  Past Psychiatric History: See H&P   Past Medical History:  Past Medical History:  Diagnosis Date   GERD (gastroesophageal reflux disease)    History reviewed. No pertinent surgical history. Family History:  Family History  Problem Relation Age of Onset   Mental illness Mother    Mental illness Brother    Emphysema Other    Kidney failure Other    Gout Father    Family Psychiatric  History: See H&P   Social History:  Social History   Substance and Sexual Activity  Alcohol Use Yes   Comment: occ     Social History   Substance and Sexual Activity  Drug Use No    Social History   Socioeconomic History   Marital status: Legally Separated    Spouse name: Not on file   Number of children: Not on file   Years of education:  Not on file   Highest education level: Not on file  Occupational History   Not on file  Tobacco Use   Smoking status: Every Day    Current packs/day: 1.00    Types: Cigarettes   Smokeless tobacco: Not on file  Substance and Sexual Activity   Alcohol use: Yes    Comment: occ   Drug use: No   Sexual activity: Not on file  Other Topics Concern   Not on file  Social History Narrative   Not on file   Social Drivers of Health   Financial Resource Strain: Low Risk  (12/17/2023)   Received from [facility]   Overall Financial Resource Strain (CARDIA)    Difficulty of Paying Living Expenses: Not hard at all  Food Insecurity: Food Insecurity Present (02/23/2024)   Hunger Vital Sign    Worried About Running Out of Food in the Last Year: Sometimes true    Ran Out of Food in the Last Year: Sometimes true  Transportation Needs: No Transportation Needs (02/23/2024)   PRAPARE - Administrator, Civil Service (Medical): No    Lack of Transportation (Non-Medical): No  Recent Concern: Transportation Needs - Unmet Transportation Needs (12/17/2023)   Received from [facility] - Transportation    In the past 12 months, has lack of transportation kept you from medical appointments or from getting medications?: Yes    Lack of Transportation (Non-Medical): Yes  Physical Activity: Unknown (04/09/2022)   Received from [facility] visits prior to [facility]16[facility]   Exercise Vital Sign    Days of Exercise per Week: Patient declined    Minutes of Exercise per Session: Not on file  Stress: Patient Declined (04/09/2022)   [facility] from [facility] visits prior to [facility]/16/2022., [facility]   [employer] of Occupational Health - Occupational Stress Questionnaire    Feeling of Stress : Patient declined  Social Connections: Unknown (04/09/2022)   [facility] to [facility]16/2022[facility]   Social Connection and Isolation Panel [NHANES]    Frequency of Communication with Friends and Family: Three times a week    Frequency of Social Gatherings with Friends and Family: Once a week    Attends Religious Services: Patient declined    Database administrator or Organizations: No    Attends Engineer, structural: Never    Marital Status: Separated   Additional Social History:   Current Medications: Current Facility-Administered Medications  Medication Dose Route  Frequency Provider Last Rate Last Admin   acamprosate  (CAMPRAL ) tablet 666 mg  666 mg Oral TID WC Lissa Riding, NP   666 mg at 02/25/24 1204   acetaminophen  (TYLENOL ) tablet 650 mg  650 mg Oral Q6H PRN Lissa Riding, NP       albuterol  (PROVENTIL ) (2.5 MG/3ML) 0.083% nebulizer solution 2.5 mg  2.5 mg Nebulization Q6H PRN Lord, Jamison Y, NP       alum & mag hydroxide-simeth (MAALOX/MYLANTA) 200-200-20 MG/5ML suspension 30 mL  30 mL Oral Q4H PRN Lissa Riding, NP       amLODipine  (NORVASC ) tablet 5 mg  5 mg Oral Daily Lissa Riding, NP   5 mg at 02/25/24 0854   apixaban  (ELIQUIS ) tablet 5 mg  5 mg Oral BID Lissa Riding, NP   5 mg at 02/25/24 0854   clonazePAM  (KLONOPIN ) tablet  0.5 mg  0.5 mg Oral BID PRN Lord, Jamison Y, NP   0.5 mg at 02/25/24 1029   FLUoxetine  (PROZAC ) capsule 40 mg  40 mg Oral Daily Bennett, Christal H, NP   40 mg at 02/25/24 0854   folic acid  (FOLVITE ) tablet 1 mg  1 mg Oral Daily Lissa Riding, NP   1 mg at 02/25/24 0854   gemfibrozil  (LOPID ) tablet 600 mg  600 mg Oral BID AC Lord, Jamison Y, NP   600 mg at 02/25/24 1610   glycopyrrolate  (ROBINUL ) tablet 1 mg  1 mg Oral BID Lissa Riding, NP   1 mg at 02/25/24 0853   hydrOXYzine (ATARAX) tablet 25 mg  25 mg Oral Q6H PRN [employer], Christal H, NP   25 mg at 02/25/24 1448   insulin  aspart (novoLOG ) injection 0-15 Units  0-15 Units Subcutaneous TID WC Lord, Jamison Y, NP   3 Units at 02/25/24 1203   insulin  aspart (novoLOG ) injection 0-5 Units  0-5 Units Subcutaneous QHS Lissa Riding, NP       insulin  glargine-yfgn (SEMGLEE ) injection 22 Units  22 Units Subcutaneous Q2000 Timmothy Foots, MD       lisinopril (ZESTRIL) tablet 20 mg  20 mg Oral Daily Bennett, Christal H, NP   20 mg at 02/25/24 0854   loperamide (IMODIUM) capsule 2-4 mg  2-4 mg Oral PRN Bennett, Christal H, NP       LORazepam  (ATIVAN ) tablet 1 mg  1 mg Oral Q6H PRN Bennett, Christal H, NP   1 mg at 02/24/24 1711   magnesium  hydroxide (MILK OF  MAGNESIA) suspension 30 mL  30 mL Oral Daily PRN Lord, Jamison Y, NP       metFORMIN (GLUCOPHAGE) tablet 500 mg  500 mg Oral BID WC Bennett, Christal H, NP   500 mg at 02/25/24 0854   multivitamin with minerals tablet 1 tablet  1 tablet Oral Daily Lord, Jamison Y, NP   1 tablet at 02/25/24 9604   mycophenolate  (CELLCEPT ) capsule 250 mg  250 mg Oral BID Lord, Jamison Y, NP   250 mg at 02/25/24 5409   nicotine (NICODERM CQ - dosed in mg/24 hours) patch 14 mg  14 mg Transdermal Daily PRN Lord, Jamison Y, NP       OLANZapine  (ZYPREXA ) tablet 5 mg  5 mg Oral BID PRN Lissa Riding, NP       Or   OLANZapine  (ZYPREXA ) injection 5 mg  5 mg Intramuscular BID PRN Lissa Riding, NP       OLANZapine  (ZYPREXA ) tablet 10 mg  10 mg Oral QHS Bennett, Christal H, NP   10 mg at 02/24/24 2124   ondansetron  (ZOFRAN ) tablet 4 mg  4 mg Oral Q6H PRN Lissa Riding, NP       Or   ondansetron  (ZOFRAN ) injection 4 mg  4 mg Intravenous Q6H PRN Lissa Riding, NP       pantoprazole  (PROTONIX ) EC tablet 40 mg  40 mg Oral Daily Lord, Jamison Y, NP   40 mg at 02/25/24 0854   prazosin (MINIPRESS) capsule 1 mg  1 mg Oral QHS Lissa Riding, NP   1 mg at 02/24/24 2124   tacrolimus  (PROGRAF ) capsule 2 mg  2 mg Oral q morning Lissa Riding, NP   2 mg at 02/25/24 1028   And   tacrolimus  (PROGRAF ) capsule 1 mg  1 mg Oral QHS Lissa Riding, NP   1  mg at 02/24/24 2124   thiamine  (Vitamin B-1) tablet 100 mg  100 mg Oral Daily Zouev, Dmitri, MD   100 mg at 02/25/24 4098   Or   thiamine  (VITAMIN B1) injection 100 mg  100 mg Intramuscular Daily Zouev, Dmitri, MD       traZODone  (DESYREL ) tablet 200 mg  200 mg Oral QHS Lord, Jamison Y, NP   200 mg at 02/24/24 2124    Lab Results:  Results for orders placed or performed during the hospital encounter of 02/23/24 (from the past 48 hours)  Glucose, capillary     Status: Abnormal   Collection Time: 02/23/24  4:55 PM  Result Value Ref Range   Glucose-Capillary 198 (H) 70 - 99  mg/dL    Comment: Glucose reference range applies only to samples taken after fasting for at least 8 hours.   Comment 1 Notify RN    Comment 2 Document in Chart   VITAMIN D 25 Hydroxy (Vit-D Deficiency, Fractures)     Status: None   Collection Time: 02/23/24  6:44 PM  Result Value Ref Range   Vit D, 25-Hydroxy 38.53 30 - 100 ng/mL    Comment: (NOTE) Vitamin D deficiency has been defined by the Institute of Medicine  and an Endocrine Society practice guideline as a level of serum 25-OH  vitamin D less than 20 ng/mL (1,2). The Endocrine Society went on to  further define vitamin D insufficiency as a level between 21 and 29  ng/mL (2).  1. IOM (Institute of Medicine). 2010. Dietary reference intakes for  calcium  and D. [place]: [employer]. 2. Holick MF, Bink[place], Bischoff-Ferrari HA, et al. Evaluation,  treatment, and prevention of vitamin D deficiency: an Endocrine  Society clinical practice guideline, JCEM. 2011 Jul; 96(7): 1911-30.  Performed at [facility], [address], [place], [place] [address]   RPR     Status: None   Collection Time: 02/23/24  6:44 PM  Result Value Ref Range   RPR Ser Ql NON REACTIVE NON REACTIVE    Comment: Performed at [facility], [address] [place], [place] [address]  TSH     Status: Abnormal   Collection Time: 02/23/24  6:44 PM  Result Value Ref Range   TSH 6.634 (H) 0.350 - 4.500 uIU/mL    Comment: Performed by a 3rd Generation assay with a functional sensitivity of <=0.01 uIU/mL. Performed at [facility], [address], [place], [place] [address]   Folate     Status: None   Collection Time: 02/23/24  6:44 PM  Result Value Ref Range   Folate 12.4 >5.9 ng/mL    Comment: Performed at [facility], [address] [place], [place] [address]  Glucose, capillary     Status: Abnormal   Collection Time: 02/23/24  9:05 PM  Result Value Ref Range   Glucose-Capillary  143 (H) 70 - 99 mg/dL    Comment: Glucose reference range applies only to samples taken after fasting for at least 8 hours.  Glucose, capillary     Status: Abnormal   Collection Time: 02/24/24  6:16 AM  Result Value Ref Range   Glucose-Capillary 176 (H) 70 - 99 mg/dL    Comment: Glucose reference range applies only to samples taken after fasting for at least 8 hours.   Comment 1 Notify RN    Comment 2 Document in Chart   Glucose, capillary     Status:  Abnormal   Collection Time: 02/24/24 12:04 PM  Result Value Ref Range   Glucose-Capillary 160 (H) 70 - 99 mg/dL    Comment: Glucose reference range applies only to samples taken after fasting for at least 8 hours.  Glucose, capillary     Status: Abnormal   Collection Time: 02/24/24  5:04 PM  Result Value Ref Range   Glucose-Capillary 185 (H) 70 - 99 mg/dL    Comment: Glucose reference range applies only to samples taken after fasting for at least 8 hours.  Glucose, capillary     Status: Abnormal   Collection Time: 02/24/24  9:17 PM  Result Value Ref Range   Glucose-Capillary 178 (H) 70 - 99 mg/dL    Comment: Glucose reference range applies only to samples taken after fasting for at least 8 hours.  Glucose, capillary     Status: Abnormal   Collection Time: 02/25/24  5:49 AM  Result Value Ref Range   Glucose-Capillary 184 (H) 70 - 99 mg/dL    Comment: Glucose reference range applies only to samples taken after fasting for at least 8 hours.   Comment 1 Notify RN    Comment 2 Document in Chart   Glucose, capillary     Status: Abnormal   Collection Time: 02/25/24 11:51 AM  Result Value Ref Range   Glucose-Capillary 187 (H) 70 - 99 mg/dL    Comment: Glucose reference range applies only to samples taken after fasting for at least 8 hours.   Comment 1 Notify RN    Comment 2 Document in Chart    Blood Alcohol level:  Lab Results  Component Value Date   ETH <15 02/20/2024   Metabolic Disorder Labs: Lab Results  Component Value Date    HGBA1C 6.9 (H) 02/20/2024   MPG 151.33 02/20/2024   No results found for: PROLACTIN No results found for: CHOL, TRIG, HDL, CHOLHDL, VLDL, LDLCALC  Physical Findings: AIMS:  , ,  ,0  ,    CIWA:  CIWA-Ar Total: 3 COWS:   N/A  Musculoskeletal: Strength & Muscle Tone: within normal limits Gait & Station: normal Patient leans: N/A  Psychiatric Specialty Exam:  Presentation  General Appearance:  Casual; Fairly Groomed  Eye Contact: Fair  Speech: Clear and Coherent; Normal Rate  Speech Volume: Normal  Handedness:Right   Mood and Affect  Mood: Anxious; Depressed  Affect: Congruent; Tearful   Thought Process  Thought Processes: Coherent; Goal Directed; Linear  Descriptions of Associations:Intact  Orientation:Full (Time, Place and Person)  Thought Content:Logical  History of Schizophrenia/Schizoaffective disorder:No data recorded Duration of Psychotic Symptoms:No data recorded Hallucinations:Hallucinations: None  Ideas of Reference:None  Suicidal Thoughts:Suicidal Thoughts: Yes, Passive SI Passive Intent and/or Plan: Without Intent; Without Plan; Without Means to Carry Out; Without Access to Means  Homicidal Thoughts:Homicidal Thoughts: No   Sensorium  Memory: Immediate Good; Recent Good; Remote Good  Judgment: Fair  Insight:Fair   Executive Functions  Concentration: Fair  Attention Span: Fair  Recall: Good  Fund of Knowledge: Fair  Language: Good   Psychomotor Activity  Psychomotor Activity:Psychomotor Activity: Normal   Assets  Assets: Communication Skills; Desire for Improvement; Resilience   Sleep  Sleep:Sleep: Good Number of Hours of Sleep: 7.5    Physical Exam: Physical Exam Vitals and nursing note reviewed.  Constitutional:      General: He is not in acute distress.    Appearance: He is obese.  HENT:     Nose: Nose normal.     Mouth/Throat:  Pharynx: Oropharynx is clear.   Cardiovascular:     Rate and Rhythm: Normal rate.     Pulses: Normal pulses.  Pulmonary:     Effort: No respiratory distress.  Genitourinary:    Comments: Deferred Musculoskeletal:        General: Normal range of motion.     Cervical back: Normal range of motion.  Skin:    General: Skin is dry.  Neurological:     General: No focal deficit present.     Mental Status: He is alert and oriented to person, place, and time.    Review of Systems  Constitutional:  Negative for chills, diaphoresis and fever.  HENT:  Negative for congestion and sore throat.   Respiratory:  Negative for cough, shortness of breath and wheezing.   Cardiovascular:  Negative for chest pain and palpitations.  Gastrointestinal:  Negative for abdominal pain, constipation, diarrhea, heartburn, nausea and vomiting.  Genitourinary:  Negative for dysuria.  Musculoskeletal:  Negative for joint pain and myalgias.  Skin:  Negative for rash.  Neurological:  Negative for dizziness, tingling, tremors, sensory change, speech change, focal weakness, seizures, loss of consciousness, weakness and headaches.  Endo/Heme/Allergies:        Allergies: Dilaudid, Morphine .  Psychiatric/Behavioral:  Positive for depression and suicidal ideas (passive ideations.). Negative for hallucinations, memory loss and substance abuse. The patient is nervous/anxious. The patient does not have insomnia.    Blood pressure 130/85, pulse 85, temperature (!) 97.5 F (36.4 C), temperature source Oral, resp. rate 16, height 5' 11 (1.803 m), weight 112 kg, SpO2 100%. Body mass index is 34.45 kg/m.  Treatment Plan Summary: Daily contact with patient to assess and evaluate symptoms and progress in treatment and Medication management  Diagnoses / Active Problems: Principal Problem:   Major depressive disorder, recurrent severe without psychotic features (HCC) Active Problems:   Post-transplant diabetes mellitus (HCC)   S/P liver transplant (HCC)    Hypertension   PLAN: Safety and Monitoring:             --  Voluntary admission to inpatient psychiatric unit for safety, stabilization and treatment             -- Daily contact with patient to assess and evaluate symptoms and progress in treatment             -- Patient's case to be discussed in multi-disciplinary team meeting             -- Observation Level : q15 minute checks             -- Vital signs:  q12 hours             -- Precautions: suicide, elopement, and assault   2. Psychiatric Diagnoses and Treatment:              MDD  GAD  PTSD              -- Completed Prozac  20 mg one time immediate dose completed -- Continue Prozac  40 mg daily                   -- Continue Zyprexa  10 mg nightly              -- Continue Klonopin  0.5 mg BID as needed             -- Continue Trazodone  200 mg daily at bedtime              --  Continue Prazosin 1 mg daily at bedtime   -- Consult to Spiritual Care   --  The risks/benefits/side-effects/alternatives to this medication were discussed in detail with the patient and time was given for questions. The patient consents to medication trial.  -- FDA             -- Metabolic profile and EKG monitoring obtained while on an atypical antipsychotic (BMI: Lipid Panel: HbgA1c: QTc:)              -- Encouraged patient to participate in unit milieu and in scheduled group therapies              -- Short Term Goals: Ability to identify changes in lifestyle to reduce recurrence of condition will improve, Ability to verbalize feelings will improve, Ability to disclose and discuss suicidal ideas, Ability to demonstrate self-control will improve, Ability to identify and develop effective coping behaviors will improve, Ability to maintain clinical measurements within normal limits will improve, and Ability to identify triggers associated with substance abuse/mental health issues will improve             -- Long Term Goals: Improvement in symptoms so as ready for  discharge                3. Medical Issues Being Addressed:              Alcohol Withdrawal              -- CIWA Ativan  protocols              -- Multivitamin, thiamine , folic acid , Protonix                 Elevated Liver Function Tests             -- Continue    Post Transplant Diabetes Mellitus  -- Resume Metformin 500 mg BID with meals              -- CBGs before every meal with moderate SSI              -- Semglee  20 units daily              -- Consult to Diabetes Coordinator for insulin  management                Hypertension             -- Continue Lisinopril 20 mg daily              -- Continue Norvasc  5 mg daily                Liver Transplant             -- Continue mycophenolate  and CellCept               -- Continue Prograf  twice daily              -- Continue acamprosate  daily with meals                Superior mesenteric thrombosis              -- Continue Eliquis          Tobacco Use Disorder             -- Nicotine patch 14 mg/24 hours ordered             -- Smoking cessation encouraged   4. Discharge Planning:              --  Social work and case management to assist with discharge planning and identification of hospital follow-up needs prior to discharge             -- Estimated LOS: 5-7 days             -- Discharge Concerns: Need to establish a safety plan; Medication compliance and effectiveness             -- Discharge Goals: Return home with outpatient referrals for mental health follow-up including medication management/psychotherapy    I certify that inpatient services furnished can re  Asuncion Layer, NP, pmhnp, fnp-bc. 02/25/2024, 3:34 PM Patient ID: Cyril Driver, male   DOB: 08/25/1976, 48 y.o.   MRN: [id number]

## 2024-02-25 NOTE — Progress Notes (Signed)
   02/25/24 1437  Spiritual Encounters  Type of Visit Initial  Care provided to: Patient  Referral source Chaplain team  Reason for visit Grief/loss   Per referral from Chaplain Nick Barman, I met with Steve Krueger following spirituality group.  Steve Krueger processed with me the deep emotion and sense of rejection he has experienced since losing his fiancee and her asking him to move out. He has a daughter, Steve Krueger, who is 46yo and shares care with her mother. Steve Krueger processed feelings of SI and was emotionally labile. He named several examples of receiving peer support on unit.  I provided compassionate presence, with both active and reflective listening. I invited Steve Krueger to view himself as others (with self-compassion) since he shared peer support at [facility]. I engaged his Steve Krueger faith outlook to reinforce his worthiness for love and to facilitate his meaning making of current experience. I prayed with Steve Krueger with his consent. Will recommend for f/u support.  Kessie Croston L. Minetta Aly, M.Div [phone]

## 2024-02-25 NOTE — Plan of Care (Signed)
  Problem: Education: Goal: Knowledge of [place] [facility] Education information/materials will improve Outcome: Progressing   Problem: Education: Goal: Knowledge of [place] [facility] Education information/materials will improve 02/25/2024 0113 by Juanda Noon, RN Outcome: Progressing 02/25/2024 0111 by Juanda Noon, RN Outcome: Progressing   Problem: Education: Goal: Emotional status will improve 02/25/2024 0113 by Juanda Noon, RN Outcome: Progressing 02/25/2024 0111 by Juanda Noon, RN Outcome: Progressing   Problem: Education: Goal: Mental status will improve Outcome: Progressing   Problem: Education: Goal: Verbalization of understanding the information provided will improve 02/25/2024 0113 by Juanda Noon, RN Outcome: Progressing 02/25/2024 0111 by Juanda Noon, RN Outcome: Progressing   Problem: Activity: Goal: Interest or engagement in activities will improve 02/25/2024 0113 by Juanda Noon, RN Outcome: Progressing 02/25/2024 0111 by Juanda Noon, RN Outcome: Progressing

## 2024-02-25 NOTE — Plan of Care (Signed)
  Problem: Education: Goal: Emotional status will improve Outcome: Progressing Goal: Verbalization of understanding the information provided will improve Outcome: Progressing   Problem: Coping: Goal: Ability to verbalize frustrations and anger appropriately will improve Outcome: Progressing   Problem: Health Behavior/Discharge Planning: Goal: Compliance with treatment plan for underlying cause of condition will improve Outcome: Progressing

## 2024-02-25 NOTE — Group Note (Signed)
 Recreation Therapy Group Note   Group Topic:Team Building  Group Date: 02/25/2024 Start Time: 0945 End Time: 1005 Facilitators: Sigismund Cross-McCall, LRT,CTRS Location: [address] Dayroom   Group Topic: Communication, Team Building, Problem Solving  Goal Area(s) Addresses:  Patient will effectively work with peer towards shared goal.  Patient will identify skills used to make activity successful.  Patient will identify how skills used during activity can be used to reach post d/c goals.   Behavioral Response: Engaged  Intervention: STEM Activity  Activity: Stage manager. In teams of 3-5, patients were given 12 plastic drinking straws and an equal length of masking tape. Using the materials provided, patients were asked to build a landing pad to catch a golf ball dropped from approximately 5 feet in the air. All materials were required to be used by the team in their design. LRT facilitated post-activity discussion.  Education: Pharmacist, community, Scientist, physiological, Discharge Planning   Education Outcome: Acknowledges education/In group clarification offered/Needs additional education.    Affect/Mood: Appropriate   Participation Level: Engaged   Participation Quality: Independent   Behavior: Appropriate   Speech/Thought Process: Focused   Insight: Good   Judgement: Good   Modes of Intervention: STEM Activity   Patient Response to Interventions:  Engaged   Education Outcome:  In group clarification offered    Clinical Observations/Individualized Feedback: Pt attended and participated in group session.      Plan: Continue to engage patient in RT group sessions 2-3x/week.   Mads Borgmeyer-McCall, LRT,CTRS  02/25/2024 1:05 PM

## 2024-02-25 NOTE — Progress Notes (Addendum)
 [facility] MD Progress Note  02/25/2024 5:09 AM Steve Krueger  MRN:  [id number]  Reason for Admission: Steve Krueger. Mennella presented to [facility] on February 20, 2024, reporting a 3-day binge of vodka use and experiencing depressive symptoms following a recent break-up with his fiance.  He endorsed suicidal ideation at the time of presentation and describes a past suicide attempt approximately 4 years ago involving an insulin  overdose.  Following medical clearance, the patient was admitted to the [facility] on February 23, 2024 for psychiatric treatment and stabilization.  Daily Notes: The patient's chart was reviewed today and his case was discussed during the routine multidisciplinary team meeting.  Upon approach, the patient was observed crying in his room, stating that he is having a nervous breakdown.  Patient states he had recently received anxiety medication and expressed significant emotional distress related to his recent break-up with his fiance and lack of stable housing.  He described his mood as still depressed, stating, I just wish it was over, but denied any suicidal thoughts, intent, or plan.  He also denied homicidal ideation and psychotic symptoms.  On a scale from 0-10, the patient rated both his anxiety and depression as 10/10.  He reported his sleep as fair, energy low, concentration and appetite as poor.  He acknowledged attending groups on the unit, finding them helpful, though reporting it is difficult to concentrate due to emotional distress.  Reinforced supportive counseling and coping strategies.  We discussed psychosocial stressors, particularly the recent break-up with his fiance, which he continues to ruminate on.  He expressed remorse and disbelief over how quickly things changed, stating they were set to be married on July 19.  He recounted an argument in which he slammed the door, leading to what he described as his fiance becoming hysterical, a reaction  he states he had never witnessed before.  The patient also expressed feelings of isolation, reporting that his only sister is not speaking to him, as she believes he messed up the best thing that ever happened to him, a sentiment that he stated he agrees with.  Patient consented to a chaplain consult for spiritual and emotional support.  He denies any side effects from current psychiatric medications. Blood sugar levels are ranging from 130-196.  The diabetes coordinator was consulted, agrees with the current management, and recommends continuing the existing orders for diabetes control at this time.  They will continue to monitor and provide additional recommendations as needed.   The case with reviewed with the attending psychiatrist, A.Parker.  Continue current psychiatric treatment plan. Continued hospitalization is necessary at this time to monitor recent medication adjustments, safety and further stabilization of mood symptoms.    Principal Problem: Major depressive disorder, recurrent severe without psychotic features (HCC) Diagnosis: Principal Problem:   Major depressive disorder, recurrent severe without psychotic features (HCC) Active Problems:   Post-transplant diabetes mellitus (HCC)   S/P liver transplant (HCC)   Hypertension  Total Time spent with patient: 45 minutes  Past Psychiatric History: See H&P   Past Medical History:  Past Medical History:  Diagnosis Date   GERD (gastroesophageal reflux disease)    History reviewed. No pertinent surgical history. Family History:  Family History  Problem Relation Age of Onset   Mental illness Mother    Mental illness Brother    Emphysema Other    Kidney failure Other    Gout Father    Family Psychiatric  History: See H&P  Social History:  Social  History   Substance and Sexual Activity  Alcohol Use Yes   Comment: occ     Social History   Substance and Sexual Activity  Drug Use No    Social History   Socioeconomic  History   Marital status: Legally Separated    Spouse name: Not on file   Number of children: Not on file   Years of education: Not on file   Highest education level: Not on file  Occupational History   Not on file  Tobacco Use   Smoking status: Every Day    Current packs/day: 1.00    Types: Cigarettes   Smokeless tobacco: Not on file  Substance and Sexual Activity   Alcohol use: Yes    Comment: occ   Drug use: No   Sexual activity: Not on file  Other Topics Concern   Not on file  Social History Narrative   Not on file   Social Drivers of Health   Financial Resource Strain: Low Risk  (12/17/2023)   Received from [facility]   Overall Financial Resource Strain (CARDIA)    Difficulty of Paying Living Expenses: Not hard at all  Food Insecurity: Food Insecurity Present (02/23/2024)   Hunger Vital Sign    Worried About Running Out of Food in the Last Year: Sometimes true    Ran Out of Food in the Last Year: Sometimes true  Transportation Needs: No Transportation Needs (02/23/2024)   PRAPARE - Administrator, Civil Service (Medical): No    Lack of Transportation (Non-Medical): No  Recent Concern: Transportation Needs - Unmet Transportation Needs (12/17/2023)   Received from [facility] - Transportation    In the past 12 months, has lack of transportation kept you from medical appointments or from getting medications?: Yes    Lack of Transportation (Non-Medical): Yes  Physical Activity: Unknown (04/09/2022)   [facility] from [facility] visits prior to [facility]16/2022[facility]   Exercise Vital Sign    Days of Exercise per Week: Patient declined    Minutes of Exercise per Session: Not on file  Stress: Patient Declined (04/09/2022)   Received from [facility] visits prior to 11/16/2022., [facility]   [employer] of Occupational Health - Occupational Stress Questionnaire    Feeling  of Stress : Patient declined  Social Connections: Unknown (04/09/2022)   [facility] prior to [facility]/16/2022[facility]   Social Connection and Isolation Panel [NHANES]    Frequency of Communication with Friends and Family: Three times a week    Frequency of Social Gatherings with Friends and Family: Once a week    Attends Religious Services: Patient declined    Database administrator or Organizations: No    Attends Engineer, structural: Never    Marital Status: Separated   Additional Social History:                           Current Medications: Current Facility-Administered Medications  Medication Dose Route Frequency Provider Last Rate Last Admin   acamprosate  (CAMPRAL ) tablet 666 mg  666 mg Oral TID WC Lissa Riding, NP   666 mg at 02/24/24 1711   acetaminophen  (TYLENOL ) tablet 650 mg  650 mg Oral Q6H PRN Lissa Riding, NP       albuterol  (PROVENTIL ) (2.5 MG/3ML) 0.083% nebulizer solution 2.5 mg  2.5 mg Nebulization Q6H PRN Lord, Jamison Y, NP       alum & mag hydroxide-simeth (MAALOX/MYLANTA) 200-200-20 MG/5ML suspension 30 mL  30 mL Oral Q4H PRN Lord, Jamison Y, NP       amLODipine  (NORVASC ) tablet 5 mg  5 mg Oral Daily Lissa Riding, NP   5 mg at 02/24/24 1610   apixaban  (ELIQUIS ) tablet 5 mg  5 mg Oral BID Lissa Riding, NP   5 mg at 02/24/24 1711   clonazePAM  (KLONOPIN ) tablet 0.5 mg  0.5 mg Oral BID PRN Lissa Riding, NP   0.5 mg at 02/24/24 2124   FLUoxetine  (PROZAC ) capsule 40 mg  40 mg Oral Daily Xavian Hardcastle H, NP   40 mg at 02/24/24 9604   folic acid  (FOLVITE ) tablet 1 mg  1 mg Oral Daily Lissa Riding, NP   1 mg at 02/24/24 5409   gemfibrozil  (LOPID ) tablet 600 mg  600 mg Oral BID AC Lord, Jamison Y, NP   600 mg at 02/24/24 1711   glycopyrrolate  (ROBINUL ) tablet 1 mg  1 mg Oral BID Lissa Riding, NP   1 mg at 02/24/24 1711   hydrOXYzine (ATARAX) tablet 25 mg  25 mg Oral Q6H PRN Rashidi Loh,  Chris Cripps H, NP       insulin  aspart (novoLOG ) injection 0-15 Units  0-15 Units Subcutaneous TID WC Lord, Jamison Y, NP   3 Units at 02/24/24 1715   insulin  aspart (novoLOG ) injection 0-5 Units  0-5 Units Subcutaneous QHS Lissa Riding, NP       insulin  glargine-yfgn (SEMGLEE ) injection 20 Units  20 Units Subcutaneous Q2000 Lissa Riding, NP   20 Units at 02/24/24 2123   lisinopril (ZESTRIL) tablet 20 mg  20 mg Oral Daily Krisandra Bueno H, NP   20 mg at 02/24/24 8119   loperamide (IMODIUM) capsule 2-4 mg  2-4 mg Oral PRN Shellby Schlink H, NP       LORazepam  (ATIVAN ) tablet 1 mg  1 mg Oral Q6H PRN [employer], Numair Masden H, NP   1 mg at 02/24/24 1711   magnesium  hydroxide (MILK OF MAGNESIA) suspension 30 mL  30 mL Oral Daily PRN Lord, Jamison Y, NP       metFORMIN (GLUCOPHAGE) tablet 500 mg  500 mg Oral BID WC Atanacio Melnyk H, NP   500 mg at 02/24/24 1711   multivitamin with minerals tablet 1 tablet  1 tablet Oral Daily Lissa Riding, NP   1 tablet at 02/24/24 1478   mycophenolate  (CELLCEPT ) capsule 250 mg  250 mg Oral BID Lord, Jamison Y, NP   250 mg at 02/24/24 1711   nicotine (NICODERM CQ - dosed in mg/24 hours) patch 14 mg  14 mg Transdermal Daily PRN Lord, Jamison Y, NP       OLANZapine  (ZYPREXA ) tablet 5 mg  5 mg Oral BID PRN Lissa Riding, NP       Or   OLANZapine  (ZYPREXA ) injection 5 mg  5 mg Intramuscular BID PRN Lord, Jamison Y, NP       OLANZapine  (ZYPREXA ) tablet 10 mg  10 mg Oral QHS Nicole Hafley H, NP   10 mg at 02/24/24 2124   ondansetron  (ZOFRAN ) tablet 4 mg  4 mg Oral Q6H PRN Lissa Riding, NP       Or   ondansetron  (ZOFRAN ) injection 4 mg  4 mg Intravenous Q6H PRN Lissa Riding, NP       pantoprazole  (  PROTONIX ) EC tablet 40 mg  40 mg Oral Daily Lord, Jamison Y, NP   40 mg at 02/24/24 0939   prazosin (MINIPRESS) capsule 1 mg  1 mg Oral QHS Lissa Riding, NP   1 mg at 02/24/24 2124   tacrolimus  (PROGRAF ) capsule 2 mg  2 mg Oral q morning Lissa Riding, NP    2 mg at 02/24/24 1478   And   tacrolimus  (PROGRAF ) capsule 1 mg  1 mg Oral QHS Lissa Riding, NP   1 mg at 02/24/24 2124   thiamine  (Vitamin B-1) tablet 100 mg  100 mg Oral Daily Zouev, Dmitri, MD       Or   thiamine  (VITAMIN B1) injection 100 mg  100 mg Intramuscular Daily Zouev, Dmitri, MD       traZODone  (DESYREL ) tablet 200 mg  200 mg Oral QHS Lord, Jamison Y, NP   200 mg at 02/24/24 2124    Lab Results:  Results for orders placed or performed during the hospital encounter of 02/23/24 (from the past 48 hours)  Glucose, capillary     Status: Abnormal   Collection Time: 02/23/24 12:05 PM  Result Value Ref Range   Glucose-Capillary 160 (H) 70 - 99 mg/dL    Comment: Glucose reference range applies only to samples taken after fasting for at least 8 hours.  Glucose, capillary     Status: Abnormal   Collection Time: 02/23/24  4:55 PM  Result Value Ref Range   Glucose-Capillary 198 (H) 70 - 99 mg/dL    Comment: Glucose reference range applies only to samples taken after fasting for at least 8 hours.   Comment 1 Notify RN    Comment 2 Document in Chart   VITAMIN D 25 Hydroxy (Vit-D Deficiency, Fractures)     Status: None   Collection Time: 02/23/24  6:44 PM  Result Value Ref Range   Vit D, 25-Hydroxy 38.53 30 - 100 ng/mL    Comment: (NOTE) Vitamin D deficiency has been defined by the Institute of Medicine  and an Endocrine Society practice guideline as a level of serum 25-OH  vitamin D less than 20 ng/mL (1,2). The Endocrine Society went on to  further define vitamin D insufficiency as a level between 21 and 29  ng/mL (2).  1. IOM (Institute of Medicine). 2010. Dietary reference intakes for  calcium  and D. [place]: [employer]. 2. Holick MF, Binkley [place], Bischoff-Ferrari HA, et al. Evaluation,  treatment, and prevention of vitamin D deficiency: an Endocrine  Society clinical practice guideline, JCEM. 2011 Jul; 96(7): 1911-30.  Performed at [facility], [address], [place], [place] [address]   RPR     Status: None   Collection Time: 02/23/24  6:44 PM  Result Value Ref Range   RPR Ser Ql NON REACTIVE NON REACTIVE    Comment: Performed at [facility], [address] [place], [place] [address]  TSH     Status: Abnormal   Collection Time: 02/23/24  6:44 PM  Result Value Ref Range   TSH 6.634 (H) 0.350 - 4.500 uIU/mL    Comment: Performed by a 3rd Generation assay with a functional sensitivity of <=0.01 uIU/mL. Performed at [facility], [address], [place], [place] [address]   Folate     Status: None   Collection Time: 02/23/24  6:44 PM  Result Value Ref Range   Folate 12.4 >5.9 ng/mL    Comment:  Performed at [facility], [address], [place], [place] [address]  Glucose, capillary     Status: Abnormal   Collection Time: 02/23/24  9:05 PM  Result Value Ref Range   Glucose-Capillary 143 (H) 70 - 99 mg/dL    Comment: Glucose reference range applies only to samples taken after fasting for at least 8 hours.  Glucose, capillary     Status: Abnormal   Collection Time: 02/24/24  6:16 AM  Result Value Ref Range   Glucose-Capillary 176 (H) 70 - 99 mg/dL    Comment: Glucose reference range applies only to samples taken after fasting for at least 8 hours.   Comment 1 Notify RN    Comment 2 Document in Chart   Glucose, capillary     Status: Abnormal   Collection Time: 02/24/24 12:04 PM  Result Value Ref Range   Glucose-Capillary 160 (H) 70 - 99 mg/dL    Comment: Glucose reference range applies only to samples taken after fasting for at least 8 hours.  Glucose, capillary     Status: Abnormal   Collection Time: 02/24/24  5:04 PM  Result Value Ref Range   Glucose-Capillary 185 (H) 70 - 99 mg/dL    Comment: Glucose reference range applies only to samples taken after fasting for at least 8 hours.  Glucose, capillary     Status: Abnormal   Collection Time: 02/24/24  9:17 PM   Result Value Ref Range   Glucose-Capillary 178 (H) 70 - 99 mg/dL    Comment: Glucose reference range applies only to samples taken after fasting for at least 8 hours.    Blood Alcohol level:  Lab Results  Component Value Date   [facility] <15 02/20/2024    Metabolic Disorder Labs: Lab Results  Component Value Date   HGBA1C 6.9 (H) 02/20/2024   MPG 151.33 02/20/2024   No results found for: PROLACTIN No results found for: CHOL, TRIG, HDL, CHOLHDL, VLDL, LDLCALC  Physical Findings: AIMS:  , ,  ,0  ,    CIWA:  CIWA-Ar Total: 2 COWS:   N/A  Musculoskeletal: Strength & Muscle Tone: within normal limits Gait & Station: normal Patient leans: N/A  Psychiatric Specialty Exam:  Presentation  General Appearance:  Casual; Fairly Groomed  Eye Contact: Good  Speech: Clear and Coherent; Normal Rate  Speech Volume: Normal  Handedness:No data recorded  Mood and Affect  Mood: Depressed; Hopeless; Worthless  Affect: Congruent; Depressed; Tearful   Thought Process  Thought Processes: Coherent; Goal Directed  Descriptions of Associations:Loose  Orientation:Full (Time, Place and Person)  Thought Content:Logical  History of Schizophrenia/Schizoaffective disorder:No data recorded Duration of Psychotic Symptoms:No data recorded Hallucinations:No data recorded Ideas of Reference:None  Suicidal Thoughts:No data recorded Homicidal Thoughts:No data recorded  Sensorium  Memory: Immediate Good; Recent Good  Judgment: Fair  Insight:No data recorded  Executive Functions  Concentration: Fair  Attention Span: Fair  Recall: Good  Fund of Knowledge: Fair  Language: Fair   Psychomotor Activity  Psychomotor Activity:No data recorded  Assets  Assets: Resilience   Sleep  Sleep:No data recorded   Physical Exam: Physical Exam Vitals and nursing note reviewed.  Constitutional:      General: He is not in acute distress.    Appearance: He  is obese.  HENT:     Nose: Nose normal.  Cardiovascular:     Rate and Rhythm: Normal rate.     Pulses: Normal pulses.  Pulmonary:     Effort: No respiratory distress.  Musculoskeletal:        General: Normal range of motion.     Cervical back: Normal range of motion.  Skin:    General: Skin is dry.  Neurological:     General: No focal deficit present.     Mental Status: He is alert and oriented to person, place, and time.    ROS Blood pressure 107/74, pulse 90, temperature 97.6 F (36.4 C), temperature source Oral, resp. rate 20, height 5' 11 (1.803 m), weight 112 kg, SpO2 98%. Body mass index is 34.45 kg/m.   Treatment Plan Summary: Daily contact with patient to assess and evaluate symptoms and progress in treatment and Medication management  Diagnoses / Active Problems: Principal Problem:   Major depressive disorder, recurrent severe without psychotic features (HCC) Active Problems:   Post-transplant diabetes mellitus (HCC)   S/P liver transplant (HCC)   Hypertension   PLAN: Safety and Monitoring:             --  Voluntary admission to inpatient psychiatric unit for safety, stabilization and treatment             -- Daily contact with patient to assess and evaluate symptoms and progress in treatment             -- Patient's case to be discussed in multi-disciplinary team meeting             -- Observation Level : q15 minute checks             -- Vital signs:  q12 hours             -- Precautions: suicide, elopement, and assault   2. Psychiatric Diagnoses and Treatment:              MDD  GAD  PTSD              -- Prozac  20 mg one time immediate dose completed -- Continue Prozac  40 mg daily                   -- Continue Zyprexa  10 mg nightly              -- Continue Klonopin  0.5 mg BID as needed             -- Continue Trazodone  200 mg daily at bedtime              -- Continue Prazosin 1 mg daily at bedtime   -- Consult to Spiritual Care   --  The  risks/benefits/side-effects/alternatives to this medication were discussed in detail with the patient and time was given for questions. The patient consents to medication trial.  -- FDA             -- Metabolic profile and EKG monitoring obtained while on an atypical antipsychotic (BMI: Lipid Panel: HbgA1c: QTc:)              -- Encouraged patient to participate in unit milieu and in scheduled group therapies              -- Short Term Goals: Ability to identify changes in lifestyle to reduce recurrence of condition will improve, Ability to verbalize feelings will improve, Ability to disclose and discuss suicidal ideas, Ability to demonstrate self-control will improve, Ability to identify and develop effective coping behaviors will improve, Ability to maintain clinical measurements within normal limits will improve, and Ability to identify triggers associated with substance abuse/mental health issues will improve             --  Long Term Goals: Improvement in symptoms so as ready for discharge                3. Medical Issues Being Addressed:              Alcohol Withdrawal              -- CIWA Ativan  protocols              -- Multivitamin, thiamine , folic acid , Protonix                 Elevated Liver Function Tests             -- Continue    Post Transplant Diabetes Mellitus  -- Resume Metformin 500 mg BID with meals              -- CBGs before every meal with moderate SSI              -- Semglee  20 units daily              -- Consult to Diabetes Coordinator for insulin  management                Hypertension             -- Resume Lisinopril 20 mg daily              -- Resume Norvasc  5 mg daily                Liver Transplant             -- Continue mycophenolate  and CellCept               -- Continue Prograf  twice daily              -- Continue acamprosate  daily with meals                Superior mesenteric thrombosis              -- Continue Eliquis          Tobacco Use Disorder              -- Nicotine patch 14 mg/24 hours ordered             -- Smoking cessation encouraged   4. Discharge Planning:              -- Social work and case management to assist with discharge planning and identification of hospital follow-up needs prior to discharge             -- Estimated LOS: 5-7 days             -- Discharge Concerns: Need to establish a safety plan; Medication compliance and effectiveness             -- Discharge Goals: Return home with outpatient referrals for mental health follow-up including medication management/psychotherapy      I certify that inpatient services furnished can re  I personally spent a total of 45 minutes in the care of the patient today including preparing to see the patient, counseling and educating, documenting clinical information in the EHR, and coordinating care. asonably be expected to improve the patient's condition.    Lennard Quirk, NP 02/25/2024, 5:09 AM

## 2024-02-25 NOTE — BHH Group Notes (Signed)
 Spirituality Group   Focus of discussion: Gratitude and Strength Awareness   Process: Following theoretical framework of group therapy of Irvin Yalom and further informed by Rogerian and Relational Cultural Theory approaches, participants invited to name:   Sources of gratitude (internal>external)   Articulate gratitude for self   Name a personal strength/gift/skill   Locate points of resonance among group members/engage the here and now   Conclude with grounding/breathwork       Observations: Merle was an active participant in the group discussion. He shared with vulnerability and gratitude for receiving peer support. Muranda Coye L. Minetta Aly, M.Div [phone]

## 2024-02-25 NOTE — Progress Notes (Signed)
   02/25/24 1710  Spiritual Encounters  Type of Visit Follow up  Care provided to: Patient  Referral source Nurse (RN/NT/LPN)   I responded to news that Steve Krueger's brother, Steve Krueger, had died suddenly.  [name] processed emotions, expressed theology of feeling G*d punishing him. Distraught also because family beliefs have led to rush for funeral.  I provided grief support, invited some alternative ways to hold himself lightly, to be open to how he makes meaning. I offred cold water, I named that in this time he is feeling shock, and advocated self-care.  [employer] notified for f/u support. Peers & staff very supportive.  Bindu Docter L. Minetta Aly, M.Div [phone]

## 2024-02-25 NOTE — Progress Notes (Signed)
   02/25/24 0900  Psych Admission Type (Psych Patients Only)  Admission Status Voluntary  Psychosocial Assessment  Patient Complaints Anxiety  Eye Contact Fair  Facial Expression Anxious  Affect Depressed  Speech Logical/coherent  Interaction Assertive  Motor Activity Other (Comment) (WNL)  Appearance/Hygiene Unremarkable  Behavior Characteristics Cooperative;Calm  Mood Anxious  Thought Process  Coherency WDL  Content WDL  Delusions None reported or observed  Perception WDL  Hallucination None reported or observed  Judgment Limited  Confusion None  Danger to Self  Current suicidal ideation? Denies  Agreement Not to Harm Self Yes  Description of Agreement verbal  Danger to Others  Danger to Others None reported or observed

## 2024-02-25 NOTE — Progress Notes (Signed)
   02/25/24 2300  Psych Admission Type (Psych Patients Only)  Admission Status Voluntary  Psychosocial Assessment  Patient Complaints Anxiety  Eye Contact Fair  Facial Expression Animated  Affect Depressed  Speech Logical/coherent  Interaction Assertive  Motor Activity Other (Comment) (WNL)  Appearance/Hygiene Unremarkable  Behavior Characteristics Cooperative;Calm  Mood Euthymic  Thought Process  Coherency WDL  Content WDL  Delusions None reported or observed  Perception WDL  Hallucination None reported or observed  Judgment Limited  Confusion None  Danger to Self  Current suicidal ideation? Denies  Self-Injurious Behavior No self-injurious ideation or behavior indicators observed or expressed   Agreement Not to Harm Self Yes  Description of Agreement Verbal contract for safety  Danger to Others  Danger to Others None reported or observed

## 2024-02-25 NOTE — Progress Notes (Signed)
 CSW spoke with patient and gave him resources for [facility] and [facility] to call for bed availability. CSW to provide patient with more housing options tomorrow on 6/12.   Edmonia Gonser, LCSWA

## 2024-02-25 NOTE — [facility] Group Notes (Signed)
 [facility] Group Notes:  (Nursing/MHT/Case Management/Adjunct)  Date:  02/25/2024  Time:  8:19 PM  Type of Therapy:  NA Group  Participation Level:  Active  Participation Quality:  Appropriate  Affect:  Appropriate  Cognitive:  Appropriate  Insight:  Appropriate  Engagement in Group:  Engaged  Modes of Intervention:  Education  Summary of Progress/Problems: Attended NA Meeting.  Alvaro Augusta 02/25/2024, 8:19 PM

## 2024-02-25 NOTE — B[facility] Counselor (Signed)
 Adult Comprehensive Assessment  Patient ID: Steve Krueger, male   DOB: 1975/12/16, 48 y.o.   MRN: [id number]  Information Source: Information source: Patient  Current Stressors:  Patient states their primary concerns and needs for treatment are:: My fiancee left me last week. Her parents don't agree with us  getting married without them being here. We got into a big argument and I ended up slamming the door on them. My fiancee called 911 on me and I got upset and felt bad so I came to the hospital. She kicked me out and won't let me return. Then I found out she has a restraining order against me but they can't serve me because I'm here in the hospital. Patient states their goals for this hospitilization and ongoing recovery are:: I need to find a living situation Educational / Learning stressors: None reported Employment / Job issues: None reported Family Relationships: things aren't good right now with my fiancee. Well she's my ex now I guess. Financial / Lack of resources (include bankruptcy): None reported Housing / Lack of housing: None reported Physical health (include injuries & life threatening diseases): None reported Social relationships: None reported Substance abuse: None reported Bereavement / Loss: None reported  Living/Environment/Situation:  Living Arrangements: Spouse/significant other Living conditions (as described by patient or guardian): I was living with my fiancee but I can't go back now since we're broken up Who else lives in the home?: No one How long has patient lived in current situation?: I'm on my own now, but prior to that we were living together for 3 years What is atmosphere in current home: Supportive  Family History:  Marital status: Single Are you sexually active?: Yes What is your sexual orientation?: Heterosexual Has your sexual activity been affected by drugs, alcohol, medication, or emotional stress?: N/A Does patient have children?:  No  Childhood History:  By whom was/is the patient raised?: Other (Comment) Additional childhood history information: Raised by aunt and uncle; mother died by suicide, so did brother Description of patient's relationship with caregiver when they were a child: Still alive Patient's description of current relationship with people who raised him/her: Good How were you disciplined when you got in trouble as a child/adolescent?: Patiet declined Does patient have siblings?: Yes Number of Siblings: 2 Description of patient's current relationship with siblings: Good Did patient suffer any verbal/emotional/physical/sexual abuse as a child?: No Did patient suffer from severe childhood neglect?: No Has patient ever been sexually abused/assaulted/raped as an adolescent or adult?: No Was the patient ever a victim of a crime or a disaster?: No Witnessed domestic violence?: No Has patient been affected by domestic violence as an adult?: No  Education:  Highest grade of school patient has completed: GED Currently a Consulting civil engineer?: No Learning disability?: No  Employment/Work Situation:   Employment Situation: On disability Why is Patient on Disability: Liver transplant How Long has Patient Been on Disability: 3 years Patient's Job has Been Impacted by Current Illness: No What is the Longest Time Patient has Held a Job?: N/A Where was the Patient Employed at that Time?: N/A Has Patient ever Been in the [employer]?: No  Financial Resources:   Surveyor, quantity resources: Insurance claims handler, Medicare Does patient have a Lawyer or guardian?: No  Alcohol/Substance Abuse:   What has been your use of drugs/alcohol within the last 12 months?: ETOH use - patient reported alcohol consumption and is interested in quitting but stated he wants to get his other doctor's appointments taken care of prior  to beginning substance use treatment. If attempted suicide, did drugs/alcohol play a role in this?:  No Alcohol/Substance Abuse Treatment Hx: Denies past history If yes, describe treatment: None reported Has alcohol/substance abuse ever caused legal problems?: Yes (Hx of DUI)  Social Support System:   Patient's Community Support System: Poor Describe Community Support System: I have my sister but she has kids in the house. She's supportive but can't be around the much Type of faith/religion: Lynder Sanger How does patient's faith help to cope with current illness?: I pray daily  Leisure/Recreation:   Do You Have Hobbies?: Yes Leisure and Hobbies: watch tv, go to the movies, or top golf, bowling ally  Strengths/Needs:   What is the patient's perception of their strengths?: I'm financially in a good place, I have a good heart Patient states they can use these personal strengths during their treatment to contribute to their recovery: I don't know right now Patient states these barriers may affect/interfere with their treatment: maybe insurance. That runs out July 31st Patient states these barriers may affect their return to the community: None reported Other important information patient would like considered in planning for their treatment: None reported  Discharge Plan:   Currently receiving community mental health services: Yes (From Whom) ([facility] Dr. Donnald Fuss  apt in next week or two) Patient states concerns and preferences for aftercare planning are: I want a ride to my car so I can get my things Patient states they will know when they are safe and ready for discharge when: I guess when I have my mental health together Does patient have access to transportation?: Yes Does patient have financial barriers related to discharge medications?: No Patient description of barriers related to discharge medications: None reported Plan for living situation after discharge: I'll need some options because right now I can't go back to my fiancee Will patient be  returning to same living situation after discharge?: No  Summary/Recommendations:   Summary and Recommendations (to be completed by the evaluator): Steve Krueger is a 48 year old male voluntarily admitted from [facility] at [facility] due to passive SI and withdrawal symptoms after consuming a gallon of vodka. Patient reported recently getting into a huge argument with his now ex-fiancee which made him feel depressed and hopeless as he was kicked out of the home they shared and is unable to return. He reported that since he has been hospitalized his ex has filed a restraining order against him for safety concerns. Patient reported no history of violence with this ex-partner. Patient endorsed the consumption of alcohol and denied the use of all other illicit, mood-altering substances. Urinary drug screen was not available at the time of assessment. Patient reported having current outpatient mental health providers consisting of a psychiatrist at [facility] by the name of Dr. Donne Gage.While here, Christoper can benefit from crisis stabilization, medication management, therapeutic milieu, and referrals for services.   Bennette Hasty M Sugey Trevathan, LCSWA  02/25/2024

## 2024-02-25 NOTE — Progress Notes (Signed)
   02/25/24 0100  Psych Admission Type (Psych Patients Only)  Admission Status Voluntary  Psychosocial Assessment  Patient Complaints Anxiety  Eye Contact Fair  Facial Expression Anxious  Affect Depressed  Speech Logical/coherent  Interaction Assertive  Motor Activity Other (Comment) (WNL)  Appearance/Hygiene Unremarkable  Behavior Characteristics Calm;Cooperative  Mood Pleasant;Euthymic  Thought Process  Coherency WDL  Content WDL  Delusions None reported or observed  Perception WDL  Hallucination None reported or observed  Judgment Limited  Confusion None  Danger to Self  Current suicidal ideation? Denies  Self-Injurious Behavior No self-injurious ideation or behavior indicators observed or expressed   Agreement Not to Harm Self Yes  Description of Agreement Verbal contract for safety  Danger to Others  Danger to Others None reported or observed

## 2024-02-25 NOTE — BHH Group Notes (Signed)
 Spiritual care group on grief and loss facilitated by Chaplain Nick Barman, [facility]  Group Goal: Support / Education around grief and loss  Members engage in facilitated group support and psycho-social education.  Group Description:  Following introductions and group rules, group members engaged in facilitated group dialogue and support around topic of loss, with particular support around experiences of loss in their lives. Group Identified types of loss (relationships / self / things) and identified patterns, circumstances, and changes that precipitate losses. Reflected on thoughts / feelings around loss, normalized grief responses, and recognized variety in grief experience. Group encouraged individual reflection on safe space and on the coping skills that they are already utilizing.  Group drew on Adlerian / Rogerian and narrative framework  Patient Progress: Steve Krueger attended group.  Though verbal participation was minimal, he demonstrated engagement and requested to meet individually before he discharges.

## 2024-02-26 DIAGNOSIS — F332 Major depressive disorder, recurrent severe without psychotic features: Secondary | ICD-10-CM | POA: Diagnosis not present

## 2024-02-26 LAB — T3, FREE: T3, Free: 2.7 pg/mL (ref 2.0–4.4)

## 2024-02-26 LAB — GLUCOSE, CAPILLARY
Glucose-Capillary: 202 mg/dL — ABNORMAL HIGH (ref 70–99)
Glucose-Capillary: 203 mg/dL — ABNORMAL HIGH (ref 70–99)
Glucose-Capillary: 203 mg/dL — ABNORMAL HIGH (ref 70–99)
Glucose-Capillary: 200 mg/dL — ABNORMAL HIGH (ref 70–99)

## 2024-02-26 MED ORDER — NICOTINE POLACRILEX 2 MG MT GUM
2.0000 mg | CHEWING_GUM | OROMUCOSAL | Status: DC | PRN
  Administered 2024-02-26 (×2): 2 mg via ORAL
  Filled 2024-02-26: qty 1

## 2024-02-26 NOTE — Group Note (Signed)
 LCSW Group Therapy Note   Group Date: 02/26/2024 Start Time: 1100 End Time: 1200   Participation:  patient was present  Type of Therapy:  Group Therapy   Topic:  Stronger Together:  Building Healthy Relationships  Objective:  To explore loneliness, boundaries, and safe ways to build relationships.  Goals: Recognize healthy vs. unhealthy relationships. Learn safe ways to connect with others. Strengthen communication and [employer].  Summary:  Participants discussed loneliness, healthy connections, and setting boundaries. They explored safe ways to meet people and shared personal experiences. Key insights were reinforced through discussion and quotes.  Therapeutic Modalities Used: Cognitive Behavioral Therapy (CBT) Elements - Identifying unhealthy relationship patterns, challenging negative thoughts about connection. Dialectical Behavior Therapy (DBT) Elements - Interpersonal effectiveness, setting and maintaining boundaries. Supportive Group Therapy - Peer discussion, shared experiences, and emotional validation.   Ramya Vanbergen O Chealsea Paske, LCSWA 02/26/2024  4:51 PM

## 2024-02-26 NOTE — Group Note (Signed)
 Therapy Group Note  Group Topic:Other  Group Date: 02/26/2024 Start Time: 1400 End Time: 1430 Facilitators: Takila Kronberg G, OT    The primary objective of this topic is to explore and understand the concept of occupational balance in the context of daily living. The term occupational balance is defined broadly, encompassing all activities that occupy an individual's time and energy, including self-care, leisure, and work-related tasks. The goal is to guide participants towards achieving a harmonious blend of these activities, tailored to their personal values and life circumstances. This balance is aimed at enhancing overall well-being, not by equally distributing time across activities, but by ensuring that daily engagements are fulfilling and not draining. The content delves into identifying various barriers that individuals face in achieving occupational balance, such as overcommitment, misaligned priorities, external pressures, and lack of effective time management. The impact of these barriers on occupational performance, roles, and lifestyles is examined, highlighting issues like reduced efficiency, strained relationships, and potential health problems. Strategies for cultivating occupational balance are a key focus. These strategies include practical methods like time blocking, prioritizing tasks, establishing self-care rituals, decluttering, connecting with nature, and engaging in reflective practices. These approaches are designed to be adaptable and applicable to a wide range of life scenarios, promoting a proactive and mindful approach to daily living. The overall aim is to equip participants with the knowledge and tools to create a balanced lifestyle that supports their mental, emotional, and physical health, thereby improving their functional performance in daily life.     Participation Level: Engaged   Participation Quality: Independent   Behavior: Appropriate   Speech/Thought  Process: Relevant   Affect/Mood: Appropriate   Insight: Fair   Judgement: Fair      Modes of Intervention: Education  Patient Response to Interventions:  Attentive   Plan: Continue to engage patient in OT groups 2 - 3x/week.  02/26/2024  Lynnda Sas, OT  Keiva Dina, OT

## 2024-02-26 NOTE — Plan of Care (Signed)
 Nurse discussed anxiety, depression and coping skills with patient.

## 2024-02-26 NOTE — BHH Group Notes (Signed)
 [facility] Group Notes:  (Nursing/MHT/Case Management/Adjunct)  Date:  02/26/2024  Time:  8:58 PM  Type of Therapy:  Wrap-up group  Participation Level:  Active  Participation Quality:  Appropriate  Affect:  Appropriate  Cognitive:  Appropriate  Insight:  Appropriate  Engagement in Group:  Engaged  Modes of Intervention:  Education  Summary of Progress/Problems: Goal Attend treatment. Rated day 9/10. Steve Krueger 02/26/2024, 8:58 PM

## 2024-02-26 NOTE — Progress Notes (Signed)
 D:  Patient's self inventory sheet, patient sleeps good, sleep medication given.  Good appetite, normal energy level, good concentration.  Rated depression 6, hopeless 3, anxiety 5.  Denied withdrawals.  Denied SI.  Denied physical problems.  Denied physical pain.  Goal is discharge.  Found a place to live.  Plans to talk to MD and CSW. A:  Medications administered per MD orders.  Emotional support and encouragement given patient. R:  Denied SI and HI, contracts for safety.  Denied A/V hallucinations.  Safety maintained with 15 minute checks.

## 2024-02-26 NOTE — Progress Notes (Signed)
 CSW spoke with patient who stated he made contact with a local [facility] yesterday afternoon and completed an interview for placement. Patient reported interview went really well and house voted to have him as a resident. They stated they would hold patient's bed but that's not typically something they do. Informed patient I would inform provider and team of this during today's progression.   Briceida Rasberry, LCSWA

## 2024-02-26 NOTE — Inpatient Diabetes Management (Signed)
 Inpatient Diabetes Program Recommendations  AACE/ADA: New Consensus Statement on Inpatient Glycemic Control (2015)  Target Ranges:  Prepandial:   less than 140 mg/dL      Peak postprandial:   less than 180 mg/dL (1-2 hours)      Critically ill patients:  140 - 180 mg/dL   Lab Results  Component Value Date   GLUCAP 203 (H) 02/26/2024   HGBA1C 6.9 (H) 02/20/2024    Review of Glycemic Control  Diabetes history: DM2 Outpatient Diabetes medications: Tresiba 40 units daily, Metformin XR 500 mg BID, Ozempic 2 mg Qweek (Monday)  Current orders for Inpatient glycemic control: Semglee  22 units daily, Novolog  0-15 TID and 0-5 HS, metformin 500 BID  HgbA1C - 6.9%  Inpatient Diabetes Program Recommendations:    Consider increasing Semglee  to 24 units daily.   Continue to follow glucose trends.  Thank you. Joni Net, RD, LDN, CDCES Inpatient Diabetes Coordinator [phone]

## 2024-02-26 NOTE — Progress Notes (Signed)
 [facility] MD Progress Note  02/26/2024 11:00 AM Steve Krueger  MRN:  [id number]  Reason for Admission: Steve Krueger presented to [facility] on February 20, 2024, reporting a 3-day binge of vodka use and experiencing depressive symptoms following a recent break-up with his fiance.  He endorsed suicidal ideation at the time of presentation and describes a past suicide attempt approximately 4 years ago involving an insulin  overdose.  Following medical clearance, the patient was admitted to the [facility] on February 23, 2024 for psychiatric treatment and stabilization.  Daily Notes: Steve Krueger is seen this morning. Chart reviewed. The chart findings discussed with the treatment team. He presents alert, oriented x 3 & aware of situation. He is visible on the unit, attending group sessions. He presents very cheerful today & sad as well. He reports, I'm feeling very happy this morning because I found a place to go to after discharge. I will be [facility] a [facility] new [facility] here in [place], [place] after discharge. I called & had an interview with the other residents in this [facility] & I was voted in. Then, I also had a very bad news last evening. My old brother, an associate pastor in a church drove off the road 2 days ago & ran into a pond. He was brought out but the EMS were unable to revive him. He is gone. I was devastated when I received the news yesterday because he was such a good man. But everyone here in this hospital rallied around me & comforted me last evening after I received the news. That helped me a lot. I slept awesome last night. I'm doing well in all my medicines. There are no side effects. I'm hoping that I will be able to get discharged tomorrow so I won't lose my spot at the [facility]. I also would like to attend my brother's funeral which is being held this weekend. I will need the time also to get a suit to wear to his funeral. Steve Krueger currently denies any HI, AVH,  delusional thoughts or paranoia. He does not appear to be responding to any internal stimuli. Patient continues to attend group sessions. Patient remains on his current plan of care as already in progress. He started his Prozac  40 mg yesterday. Reviewed, vital signs stable. Rates depression #1 & anxiety #0. This case was discussed during the treatment team meeting this afternoon.  Principal Problem: Major depressive disorder, recurrent severe without psychotic features (HCC) Diagnosis: Principal Problem:   Major depressive disorder, recurrent severe without psychotic features (HCC) Active Problems:   Post-transplant diabetes mellitus (HCC)   S/P liver transplant (HCC)   Hypertension  Total Time spent with patient: 35 minutes  Past Psychiatric History: See H&P   Past Medical History:  Past Medical History:  Diagnosis Date   GERD (gastroesophageal reflux disease)    History reviewed. No pertinent surgical history. Family History:  Family History  Problem Relation Age of Onset   Mental illness Mother    Mental illness Brother    Emphysema Other    Kidney failure Other    Gout Father    Family Psychiatric  History: See H&P   Social History:  Social History   Substance and Sexual Activity  Alcohol Use Yes   Comment: occ     Social History   Substance and Sexual Activity  Drug Use No    Social History   Socioeconomic History   Marital status: Legally Separated  Spouse name: Not on file   Number of children: Not on file   Years of education: Not on file   Highest education level: Not on file  Occupational History   Not on file  Tobacco Use   Smoking status: Every Day    Current packs/day: 1.00    Types: Cigarettes   Smokeless tobacco: Not on file  Substance and Sexual Activity   Alcohol use: Yes    Comment: occ   Drug use: No   Sexual activity: Not on file  Other Topics Concern   Not on file  Social History Narrative   Not on file   Social Drivers of  Health   Financial Resource Strain: Low Risk  (12/17/2023)   Received from [facility]   Overall Financial Resource Strain (CARDIA)    Difficulty of Paying Living Expenses: Not hard at all  Food Insecurity: Food Insecurity Present (02/23/2024)   Hunger Vital Sign    Worried About Running Out of Food in the Last Year: Sometimes true    Ran Out of Food in the Last Year: Sometimes true  Transportation Needs: No Transportation Needs (02/23/2024)   PRAPARE - Administrator, Civil Service (Medical): No    Lack of Transportation (Non-Medical): No  Recent Concern: Transportation Needs - Unmet Transportation Needs (12/17/2023)   Received from [facility] - Transportation    In the past 12 months, has lack of transportation kept you from medical appointments or from getting medications?: Yes    Lack of Transportation (Non-Medical): Yes  Physical Activity: Unknown (04/09/2022)   [facility] to [facility]16[facility]   Exercise Vital Sign    Days of Exercise per Week: Patient declined    Minutes of Exercise per Session: Not on file  Stress: Patient Declined (04/09/2022)   [facility] from [facility] visits [facility] to [facility]16/[facility]   [employer] of Occupational Health - Occupational Stress Questionnaire    Feeling of Stress : Patient declined  Social Connections: Unknown (04/09/2022)   [facility] to [facility]16[facility]   Social Connection and Isolation Panel    Frequency of Communication with Friends and Family: Three times a week    Frequency of Social Gatherings with Friends and Family: Once a week    Attends Religious Services: Patient declined    Database administrator or Organizations: No    Attends Engineer, structural: Never    Marital Status: Separated   Additional Social History:    Current Medications: Current Facility-Administered Medications  Medication Dose Route Frequency Provider Last Rate Last Admin   acamprosate  (CAMPRAL ) tablet 666 mg  666 mg Oral TID WC Lissa Riding, NP   666 mg at 02/26/24 5409   acetaminophen  (TYLENOL ) tablet 650 mg  650 mg Oral Q6H PRN Lissa Riding, NP       albuterol  (PROVENTIL ) (2.5 MG/3ML) 0.083% nebulizer solution 2.5 mg  2.5 mg Nebulization Q6H PRN Lord, Jamison Y, NP       alum & mag hydroxide-simeth (MAALOX/MYLANTA) 200-200-20 MG/5ML suspension 30 mL  30 mL Oral Q4H PRN Lissa Riding, NP       amLODipine  (NORVASC ) tablet 5 mg  5 mg Oral Daily Lissa Riding, NP   5 mg at 02/26/24 0900   apixaban  (ELIQUIS ) tablet 5 mg  5 mg Oral  BID Lord, Jamison Y, NP   5 mg at 02/26/24 0900   clonazePAM  (KLONOPIN ) tablet 0.5 mg  0.5 mg Oral BID PRN Lord, Jamison Y, NP   0.5 mg at 02/25/24 2135   FLUoxetine  (PROZAC ) capsule 40 mg  40 mg Oral Daily Bennett, Christal H, NP   40 mg at 02/26/24 0857   folic acid  (FOLVITE ) tablet 1 mg  1 mg Oral Daily Lord, Jamison Y, NP   1 mg at 02/26/24 0857   gemfibrozil  (LOPID ) tablet 600 mg  600 mg Oral BID AC Lord, Jamison Y, NP   600 mg at 02/26/24 5784   glycopyrrolate  (ROBINUL ) tablet 1 mg  1 mg Oral BID Lissa Riding, NP   1 mg at 02/26/24 0846   hydrOXYzine (ATARAX) tablet 25 mg  25 mg Oral Q6H PRN [employer], Christal H, NP   25 mg at 02/26/24 0920   insulin  aspart (novoLOG ) injection 0-15 Units  0-15 Units Subcutaneous TID WC Lord, Jamison Y, NP   5 Units at 02/26/24 6962   insulin  aspart (novoLOG ) injection 0-5 Units  0-5 Units Subcutaneous QHS Lissa Riding, NP       insulin  glargine-yfgn (SEMGLEE ) injection 22 Units  22 Units Subcutaneous Q2000 Parker, Alvin S, MD   22 Units at 02/25/24 2133   lisinopril (ZESTRIL) tablet 20 mg  20 mg Oral Daily Bennett, Christal H, NP   20 mg at 02/26/24 0854   loperamide (IMODIUM) capsule 2-4 mg  2-4 mg Oral PRN Bennett, Christal H, NP       LORazepam  (ATIVAN )  tablet 1 mg  1 mg Oral Q6H PRN Bennett, Christal H, NP   1 mg at 02/24/24 1711   magnesium  hydroxide (MILK OF MAGNESIA) suspension 30 mL  30 mL Oral Daily PRN Lord, Jamison Y, NP       metFORMIN (GLUCOPHAGE) tablet 500 mg  500 mg Oral BID WC Bennett, Christal H, NP   500 mg at 02/26/24 0854   multivitamin with minerals tablet 1 tablet  1 tablet Oral Daily Lord, Jamison Y, NP   1 tablet at 02/26/24 9528   mycophenolate  (CELLCEPT ) capsule 250 mg  250 mg Oral BID Lord, Jamison Y, NP   250 mg at 02/26/24 0846   nicotine (NICODERM CQ - dosed in mg/24 hours) patch 14 mg  14 mg Transdermal Daily PRN Lord, Jamison Y, NP       OLANZapine  (ZYPREXA ) tablet 5 mg  5 mg Oral BID PRN Lissa Riding, NP       Or   OLANZapine  (ZYPREXA ) injection 5 mg  5 mg Intramuscular BID PRN Lissa Riding, NP       OLANZapine  (ZYPREXA ) tablet 10 mg  10 mg Oral QHS Bennett, Christal H, NP   10 mg at 02/25/24 2132   ondansetron  (ZOFRAN ) tablet 4 mg  4 mg Oral Q6H PRN Lissa Riding, NP       Or   ondansetron  (ZOFRAN ) injection 4 mg  4 mg Intravenous Q6H PRN Lissa Riding, NP       pantoprazole  (PROTONIX ) EC tablet 40 mg  40 mg Oral Daily Lissa Riding, NP   40 mg at 02/26/24 0854   prazosin (MINIPRESS) capsule 1 mg  1 mg Oral QHS Lissa Riding, NP   1 mg at 02/25/24 2132   tacrolimus  (PROGRAF ) capsule 2 mg  2 mg Oral q morning Lord, Jamison Y, NP   2 mg at 02/26/24 [phone]  And   tacrolimus  (PROGRAF ) capsule 1 mg  1 mg Oral QHS Lissa Riding, NP   1 mg at 02/25/24 2134   thiamine  (Vitamin B-1) tablet 100 mg  100 mg Oral Daily Zouev, Dmitri, MD   100 mg at 02/26/24 4098   Or   thiamine  (VITAMIN B1) injection 100 mg  100 mg Intramuscular Daily Zouev, Dmitri, MD       traZODone  (DESYREL ) tablet 200 mg  200 mg Oral QHS Lord, Jamison Y, NP   200 mg at 02/25/24 2132   Lab Results:  Results for orders placed or performed during the hospital encounter of 02/23/24 (from the past 48 hours)  Glucose, capillary     Status:  Abnormal   Collection Time: 02/24/24 12:04 PM  Result Value Ref Range   Glucose-Capillary 160 (H) 70 - 99 mg/dL    Comment: Glucose reference range applies only to samples taken after fasting for at least 8 hours.  Glucose, capillary     Status: Abnormal   Collection Time: 02/24/24  5:04 PM  Result Value Ref Range   Glucose-Capillary 185 (H) 70 - 99 mg/dL    Comment: Glucose reference range applies only to samples taken after fasting for at least 8 hours.  T3, free     Status: None   Collection Time: 02/24/24  6:22 PM  Result Value Ref Range   T3, Free 2.7 2.0 - 4.4 pg/mL    Comment: (NOTE) Performed At: [facility] [address] [place], [place] [id number] Pearlean Botts MD FA:[phone]   Glucose, capillary     Status: Abnormal   Collection Time: 02/24/24  9:17 PM  Result Value Ref Range   Glucose-Capillary 178 (H) 70 - 99 mg/dL    Comment: Glucose reference range applies only to samples taken after fasting for at least 8 hours.  Glucose, capillary     Status: Abnormal   Collection Time: 02/25/24  5:49 AM  Result Value Ref Range   Glucose-Capillary 184 (H) 70 - 99 mg/dL    Comment: Glucose reference range applies only to samples taken after fasting for at least 8 hours.   Comment 1 Notify RN    Comment 2 Document in Chart   Glucose, capillary     Status: Abnormal   Collection Time: 02/25/24 11:51 AM  Result Value Ref Range   Glucose-Capillary 187 (H) 70 - 99 mg/dL    Comment: Glucose reference range applies only to samples taken after fasting for at least 8 hours.   Comment 1 Notify RN    Comment 2 Document in Chart   Glucose, capillary     Status: Abnormal   Collection Time: 02/25/24  5:09 PM  Result Value Ref Range   Glucose-Capillary 223 (H) 70 - 99 mg/dL    Comment: Glucose reference range applies only to samples taken after fasting for at least 8 hours.  T4, free     Status: None   Collection Time: 02/25/24  6:37 PM  Result Value Ref Range   Free T4 0.86  0.61 - 1.12 ng/dL    Comment: (NOTE) Biotin ingestion may interfere with free T4 tests. If the results are inconsistent with the TSH level, previous test results, or the clinical presentation, then consider biotin interference. If needed, order repeat testing after stopping biotin. Performed at [facility], [address] [place], [place] [address]   Glucose, capillary     Status: Abnormal   Collection Time: 02/25/24  9:20 PM  Result Value Ref Range   Glucose-Capillary 177 (H) 70 - 99 mg/dL    Comment: Glucose reference range applies only to samples taken after fasting for at least 8 hours.  Glucose, capillary     Status: Abnormal   Collection Time: 02/26/24  6:32 AM  Result Value Ref Range   Glucose-Capillary 202 (H) 70 - 99 mg/dL    Comment: Glucose reference range applies only to samples taken after fasting for at least 8 hours.   Blood Alcohol level:  Lab Results  Component Value Date   [facility] <15 02/20/2024   Metabolic Disorder Labs: Lab Results  Component Value Date   HGBA1C 6.9 (H) 02/20/2024   MPG 151.33 02/20/2024   No results found for: PROLACTIN No results found for: CHOL, TRIG, HDL, CHOLHDL, VLDL, LDLCALC  Physical Findings: AIMS:  , ,  ,0  ,    CIWA:  CIWA-Ar Total: 2 COWS:   N/A  Musculoskeletal: Strength & Muscle Tone: within normal limits Gait & Station: normal Patient leans: N/A  Psychiatric Specialty Exam:  Presentation  General Appearance:  Casual; Fairly Groomed  Eye Contact: Fair  Speech: Clear and Coherent; Normal Rate  Speech Volume: Normal  Handedness:Right   Mood and Affect  Mood: Anxious; Depressed  Affect: Congruent; Tearful  Thought Process  Thought Processes: Coherent; Goal Directed; Linear  Descriptions of Associations:Intact  Orientation:Full (Time, Place and Person)  Thought Content:Logical  History of Schizophrenia/Schizoaffective disorder:No data recorded Duration of Psychotic  Symptoms:No data recorded Hallucinations:Hallucinations: None  Ideas of Reference:None  Suicidal Thoughts:Suicidal Thoughts: Yes, Passive SI Passive Intent and/or Plan: Without Intent; Without Plan; Without Means to Carry Out; Without Access to Means  Homicidal Thoughts:Homicidal Thoughts: No  Sensorium  Memory: Immediate Good; Recent Good; Remote Good  Judgment: Fair  Insight:Fair  Executive Functions  Concentration: Fair  Attention Span: Fair  Recall: Good  Fund of Knowledge: Fair  Language: Good  Psychomotor Activity  Psychomotor Activity:Psychomotor Activity: Normal   Assets  Assets: Communication Skills; Desire for Improvement; Resilience  Sleep  Sleep:Sleep: Good Number of Hours of Sleep: 7.5  Physical Exam: Physical Exam Vitals and nursing note reviewed.  Constitutional:      General: He is not in acute distress.    Appearance: He is obese.  HENT:     Nose: Nose normal.     Mouth/Throat:     Pharynx: Oropharynx is clear.   Cardiovascular:     Rate and Rhythm: Normal rate.     Pulses: Normal pulses.  Pulmonary:     Effort: No respiratory distress.  Genitourinary:    Comments: Deferred  Musculoskeletal:        General: Normal range of motion.     Cervical back: Normal range of motion.   Skin:    General: Skin is dry.   Neurological:     General: No focal deficit present.     Mental Status: He is alert and oriented to person, place, and time.    Review of Systems  Constitutional:  Negative for chills, diaphoresis and fever.  HENT:  Negative for congestion and sore throat.   Respiratory:  Negative for cough, shortness of breath and wheezing.   Cardiovascular:  Negative for chest pain and palpitations.  Gastrointestinal:  Negative for abdominal pain, constipation, diarrhea, heartburn, nausea and vomiting.  Genitourinary:  Negative for dysuria.  Musculoskeletal:  Negative for joint pain and myalgias.  Skin:  Negative for rash.   Neurological:  Negative for dizziness, tingling, tremors, sensory change,  speech change, focal weakness, seizures, loss of consciousness, weakness and headaches.  Endo/Heme/Allergies:        Allergies: Dilaudid, Morphine .  Psychiatric/Behavioral:  Positive for depression and suicidal ideas (passive ideations.). Negative for hallucinations, memory loss and substance abuse. The patient is nervous/anxious. The patient does not have insomnia.    Blood pressure (!) 128/94, pulse 80, temperature (!) 97.5 F (36.4 C), temperature source Oral, resp. rate 18, height 5' 11 (1.803 m), weight 112 kg, SpO2 97%. Body mass index is 34.45 kg/m.  Treatment Plan Summary: Daily contact with patient to assess and evaluate symptoms and progress in treatment and Medication management  Diagnoses / Active Problems: Principal Problem:   Major depressive disorder, recurrent severe without psychotic features (HCC) Active Problems:   Post-transplant diabetes mellitus (HCC)   S/P liver transplant (HCC)   Hypertension   PLAN: Safety and Monitoring:             --  Voluntary admission to inpatient psychiatric unit for safety, stabilization and treatment             -- Daily contact with patient to assess and evaluate symptoms and progress in treatment             -- Patient's case to be discussed in multi-disciplinary team meeting             -- Observation Level : q15 minute checks             -- Vital signs:  q12 hours             -- Precautions: suicide, elopement, and assault   2. Psychiatric Diagnoses and Treatment:              MDD  GAD  PTSD              -- Completed Prozac  20 mg one time immediate dose completed -- Continue Prozac  40 mg daily                   -- Continue Zyprexa  10 mg nightly              -- Continue Klonopin  0.5 mg BID as needed             -- Continue Trazodone  200 mg daily at bedtime              -- Continue Prazosin 1 mg daily at bedtime   -- Consult to Spiritual Care   --   The risks/benefits/side-effects/alternatives to this medication were discussed in detail with the patient and time was given for questions. The patient consents to medication trial.  -- FDA             -- Metabolic profile and EKG monitoring obtained while on an atypical antipsychotic (BMI: Lipid Panel: HbgA1c: QTc:)              -- Encouraged patient to participate in unit milieu and in scheduled group therapies              -- Short Term Goals: Ability to identify changes in lifestyle to reduce recurrence of condition will improve, Ability to verbalize feelings will improve, Ability to disclose and discuss suicidal ideas, Ability to demonstrate self-control will improve, Ability to identify and develop effective coping behaviors will improve, Ability to maintain clinical measurements within normal limits will improve, and Ability to identify triggers associated with substance abuse/mental health issues will improve             --  Long Term Goals: Improvement in symptoms so as ready for discharge                3. Medical Issues Being Addressed:              Alcohol Withdrawal              -- CIWA Ativan  protocols              -- Multivitamin, thiamine , folic acid , Protonix                 Elevated Liver Function Tests             -- Continue    Post Transplant Diabetes Mellitus  -- Resume Metformin 500 mg BID with meals              -- CBGs before every meal with moderate SSI              -- Semglee  20 units daily              -- Consult to Diabetes Coordinator for insulin  management                Hypertension             -- Continue Lisinopril 20 mg daily              -- Continue Norvasc  5 mg daily                Liver Transplant             -- Continue mycophenolate  and CellCept               -- Continue Prograf  twice daily              -- Continue acamprosate  daily with meals                Superior mesenteric thrombosis              -- Continue Eliquis          Tobacco Use  Disorder             -- Nicotine patch 14 mg/24 hours ordered             -- Smoking cessation encouraged   4. Discharge Planning:              -- Social work and case management to assist with discharge planning and identification of hospital follow-up needs prior to discharge             -- Estimated LOS: 5-7 days             -- Discharge Concerns: Need to establish a safety plan; Medication compliance and effectiveness             -- Discharge Goals: Return home with outpatient referrals for mental health follow-up including medication management/psychotherapy    I certify that inpatient services furnished can re  Asuncion Layer, NP, pmhnp, fnp-bc. 02/26/2024, 11:00 AM Patient ID: Cyril Driver, male   DOB: 06-Nov-1975, 48 y.o.   MRN: [id number] Patient ID: ASBURY HAIR, male   DOB: 1976-06-17, 48 y.o.   MRN: [id number]

## 2024-02-26 NOTE — Progress Notes (Signed)
 I provided follow up grief support to Bambi Lever after the unexpected news of his brother's death yesterday.  I provided listening as he shared about his relationship with his brother.  For him, his brother was the one person that never gave up on him.  His mother died by suicide when he was an infant.  His older brother died by suicide as well.  Both were around the age that he is now.  He has a strong faith and though he doesn't understand why this has happened, he is trying to accept it. I provided listening, facilitated sharing and normalized grief responses. I provided prayer at his request.

## 2024-02-26 NOTE — Plan of Care (Signed)
   Problem: Education: Goal: Knowledge of [facility] Education information/materials will improve Outcome: Progressing Goal: Emotional status will improve Outcome: Progressing Goal: Mental status will improve Outcome: Progressing Goal: Verbalization of understanding the information provided will improve Outcome: Progressing

## 2024-02-27 ENCOUNTER — Encounter (HOSPITAL_COMMUNITY): Payer: Self-pay

## 2024-02-27 DIAGNOSIS — F332 Major depressive disorder, recurrent severe without psychotic features: Secondary | ICD-10-CM | POA: Diagnosis not present

## 2024-02-27 LAB — GLUCOSE, CAPILLARY
Glucose-Capillary: 201 mg/dL — ABNORMAL HIGH (ref 70–99)
Glucose-Capillary: 182 mg/dL — ABNORMAL HIGH (ref 70–99)

## 2024-02-27 MED ORDER — OLANZAPINE 10 MG PO TABS: 10.0000 mg | ORAL_TABLET | Freq: Every day | ORAL | 0 refills | Status: AC

## 2024-02-27 MED ORDER — TRAZODONE HCL 100 MG PO TABS: 200.0000 mg | ORAL_TABLET | Freq: Every day | ORAL | 0 refills | Status: AC

## 2024-02-27 MED ORDER — GLYCOPYRROLATE 1 MG PO TABS: 1.0000 mg | ORAL_TABLET | Freq: Two times a day (BID) | ORAL | 0 refills | Status: AC

## 2024-02-27 MED ORDER — INSULIN DEGLUDEC 100 UNIT/ML ~~LOC~~ SOPN: 40.0000 [IU] | PEN_INJECTOR | Freq: Every day | SUBCUTANEOUS | 0 refills | Status: AC

## 2024-02-27 MED ORDER — NICOTINE POLACRILEX 2 MG MT GUM: 2.0000 mg | CHEWING_GUM | OROMUCOSAL | Status: AC | PRN

## 2024-02-27 MED ORDER — PRAZOSIN HCL 1 MG PO CAPS: 1.0000 mg | ORAL_CAPSULE | Freq: Every day | ORAL | 0 refills | Status: AC

## 2024-02-27 MED ORDER — ACAMPROSATE CALCIUM 333 MG PO TBEC: 666.0000 mg | DELAYED_RELEASE_TABLET | Freq: Three times a day (TID) | ORAL | 0 refills | Status: AC

## 2024-02-27 MED ORDER — FLUOXETINE HCL 40 MG PO CAPS: 40.0000 mg | ORAL_CAPSULE | Freq: Every day | ORAL | 0 refills | Status: AC

## 2024-02-27 MED ORDER — NOVOLIN R 100 UNIT/ML IJ SOLN: 20.0000 [IU] | Freq: Two times a day (BID) | INTRAMUSCULAR | 0 refills | Status: AC

## 2024-02-27 NOTE — Progress Notes (Signed)
 Pt discharged to the lobby for transport to take him to his car at [facility]. Pt was explained his discharge instructions. Pt expressed understanding. Pt denies SI,HI, and AVH.

## 2024-02-27 NOTE — Progress Notes (Signed)
  [facility] Adult Case Management Discharge Plan :  Will you be returning to the same living situation after discharge:  No. At discharge, do you have transportation home?: Yes,  pt will be picked up from [facility] by safe transport and taken to his vehicle at [facility] ED parking lot. Do you have the ability to pay for your medications: Yes,  pt insured - [facility]  Release of information consent forms completed and in the chart;  Patient's signature needed at discharge.  Patient to Follow up at:  Follow-up Information     [facility] Go on 03/02/2024.   Specialty: Professional Counselor Why: Please go to this provider on 03/02/24 at 9:00 am for an assessment, to receive therapy services.  You may also go Monday through Friday, from 9 am to 1 pm. Contact information: [address] [place]  [address] [place] [address] [phone]         Donnald Fuss, MD Follow up on 03/01/2024.   Specialty: Psychiatry Why: You have an appointment for medication management services on 03/02/23 at 4:00 pm. Contact information: [address] [place] [address] [phone]                 Next level of care provider has access to [facility] Link:no  Safety Planning and Suicide Prevention discussed: Yes,  completed with patient as family was not able to be reached after making multiple attempts.      Has patient been referred to the Quitline?: Patient refused referral for treatment  Patient has been referred for addiction treatment: Patient refused referral for treatment.  Skye Plamondon M Abdiaziz Klahn, LCSWA 02/27/2024, 9:40 AM

## 2024-02-27 NOTE — [facility] Suicide Risk Assessment (Signed)
 [facility] INPATIENT:  Family/Significant Other Suicide Prevention Education  Suicide Prevention Education:  Contact Attempts: Stephanie MacFaden (ex-fiancee) [phone], (name of family member/significant other) has been identified by the patient as the family member/significant other with whom the patient will be residing, and identified as the person(s) who will aid the patient in the event of a mental health crisis.  With written consent from the patient, two attempts were made to provide suicide prevention education, prior to and/or following the patient's discharge.  We were unsuccessful in providing suicide prevention education.  A suicide education pamphlet was given to the patient to share with family/significant other.  Date and time of first attempt:02/26/24/3:30PM Date and time of second attempt:02/27/24/8:50AM  Irvin Bastin M Keaun Schnabel, LCSWA 02/27/2024, 9:38 AM

## 2024-02-27 NOTE — [facility] IP Treatment Plan (Signed)
 Interdisciplinary Treatment and Diagnostic Plan Update  02/27/2024 Time of Session: 12:05 PM - UPDATE Steve Krueger MRN: [id number]  Principal Diagnosis: Major depressive disorder, recurrent severe without psychotic features (HCC)  Secondary Diagnoses: Principal Problem:   Major depressive disorder, recurrent severe without psychotic features (HCC) Active Problems:   Post-transplant diabetes mellitus (HCC)   S/P liver transplant (HCC)   Hypertension   Current Medications:  Current Facility-Administered Medications  Medication Dose Route Frequency Provider Last Rate Last Admin   acamprosate  (CAMPRAL ) tablet 666 mg  666 mg Oral TID WC Lissa Riding, NP   666 mg at 02/27/24 7829   acetaminophen  (TYLENOL ) tablet 650 mg  650 mg Oral Q6H PRN Lissa Riding, NP       albuterol  (PROVENTIL ) (2.5 MG/3ML) 0.083% nebulizer solution 2.5 mg  2.5 mg Nebulization Q6H PRN Lord, Jamison Y, NP       alum & mag hydroxide-simeth (MAALOX/MYLANTA) 200-200-20 MG/5ML suspension 30 mL  30 mL Oral Q4H PRN Lissa Riding, NP       amLODipine  (NORVASC ) tablet 5 mg  5 mg Oral Daily Lord, Jamison Y, NP   5 mg at 02/27/24 5621   apixaban  (ELIQUIS ) tablet 5 mg  5 mg Oral BID Lissa Riding, NP   5 mg at 02/27/24 0900   clonazePAM  (KLONOPIN ) tablet 0.5 mg  0.5 mg Oral BID PRN Lissa Riding, NP   0.5 mg at 02/26/24 2146   FLUoxetine  (PROZAC ) capsule 40 mg  40 mg Oral Daily Bennett, Christal H, NP   40 mg at 02/27/24 0836   folic acid  (FOLVITE ) tablet 1 mg  1 mg Oral Daily Lord, Jamison Y, NP   1 mg at 02/27/24 3086   gemfibrozil  (LOPID ) tablet 600 mg  600 mg Oral BID AC Lord, Jamison Y, NP   600 mg at 02/27/24 5784   glycopyrrolate  (ROBINUL ) tablet 1 mg  1 mg Oral BID Lissa Riding, NP   1 mg at 02/27/24 0900   insulin  aspart (novoLOG ) injection 0-15 Units  0-15 Units Subcutaneous TID WC Lissa Riding, NP   5 Units at 02/27/24 6962   insulin  aspart (novoLOG ) injection 0-5 Units  0-5 Units Subcutaneous QHS  Lissa Riding, NP   0 Units at 02/26/24 2146   insulin  glargine-yfgn (SEMGLEE ) injection 22 Units  22 Units Subcutaneous Q2000 Timmothy Foots, MD   22 Units at 02/26/24 2149   lisinopril  (ZESTRIL ) tablet 20 mg  20 mg Oral Daily Bennett, Christal H, NP   20 mg at 02/27/24 9528   magnesium  hydroxide (MILK OF MAGNESIA) suspension 30 mL  30 mL Oral Daily PRN Lissa Riding, NP       metFORMIN  (GLUCOPHAGE ) tablet 500 mg  500 mg Oral BID WC Bennett, Christal H, NP   500 mg at 02/27/24 0836   multivitamin with minerals tablet 1 tablet  1 tablet Oral Daily Lissa Riding, NP   1 tablet at 02/27/24 4132   mycophenolate  (CELLCEPT ) capsule 250 mg  250 mg Oral BID Lord, Jamison Y, NP   250 mg at 02/27/24 0900   nicotine  (NICODERM CQ  - dosed in mg/24 hours) patch 14 mg  14 mg Transdermal Daily PRN Lord, Jamison Y, NP       nicotine  polacrilex (NICORETTE ) gum 2 mg  2 mg Oral PRN Zouev, Dmitri, MD   2 mg at 02/26/24 1631   OLANZapine  (ZYPREXA ) tablet 5 mg  5 mg Oral BID PRN Lord, Jamison  Y, NP       Or   OLANZapine  (ZYPREXA ) injection 5 mg  5 mg Intramuscular BID PRN Lord, Jamison Y, NP       OLANZapine  (ZYPREXA ) tablet 10 mg  10 mg Oral QHS Bennett, Christal H, NP   10 mg at 02/26/24 2145   ondansetron  (ZOFRAN ) tablet 4 mg  4 mg Oral Q6H PRN Lissa Riding, NP       Or   ondansetron  (ZOFRAN ) injection 4 mg  4 mg Intravenous Q6H PRN Lissa Riding, NP       pantoprazole  (PROTONIX ) EC tablet 40 mg  40 mg Oral Daily Lissa Riding, NP   40 mg at 02/27/24 0900   prazosin  (MINIPRESS ) capsule 1 mg  1 mg Oral QHS Lissa Riding, NP   1 mg at 02/26/24 2144   tacrolimus  (PROGRAF ) capsule 2 mg  2 mg Oral q morning Lissa Riding, NP   2 mg at 02/27/24 4098   And   tacrolimus  (PROGRAF ) capsule 1 mg  1 mg Oral QHS Lissa Riding, NP   1 mg at 02/26/24 2144   thiamine  (Vitamin B-1) tablet 100 mg  100 mg Oral Daily Zouev, Dmitri, MD   100 mg at 02/27/24 1191   Or   thiamine  (VITAMIN B1) injection 100 mg  100  mg Intramuscular Daily Zouev, Dmitri, MD       traZODone  (DESYREL ) tablet 200 mg  200 mg Oral QHS Lord, Jamison Y, NP   200 mg at 02/26/24 2144   PTA Medications: Medications Prior to Admission  Medication Sig Dispense Refill Last Dose/Taking   folic acid  (FOLVITE ) 1 MG tablet Take 1 tablet (1 mg total) by mouth daily.   Taking   nicotine  (NICODERM CQ  - DOSED IN MG/24 HOURS) 14 mg/24hr patch Place 1 patch (14 mg total) onto the skin daily as needed (tobacco dependence).   Taking As Needed   thiamine  (VITAMIN B-1) 100 MG tablet Take 1 tablet (100 mg total) by mouth daily.   Taking   acamprosate  (CAMPRAL ) 333 MG tablet Take 666 mg by mouth 3 (three) times daily with meals.      amLODipine  (NORVASC ) 5 MG tablet Take 5 mg by mouth daily.      apixaban  (ELIQUIS ) 5 MG TABS tablet Take 5 mg by mouth 2 (two) times daily.      clonazePAM  (KLONOPIN ) 0.5 MG tablet Take 1 tablet by mouth 2 (two) times daily as needed.      FLUoxetine  (PROZAC ) 20 MG capsule Take 20 mg by mouth daily.      gemfibrozil  (LOPID ) 600 MG tablet Take 600 mg by mouth 2 (two) times daily before a meal.      lisinopril  (ZESTRIL ) 20 MG tablet Take 20 mg by mouth daily.      metFORMIN  (GLUCOPHAGE -XR) 500 MG 24 hr tablet Take 1 tablet by mouth 2 (two) times daily.      mycophenolate  (CELLCEPT ) 250 MG capsule Take 250 mg by mouth 2 (two) times daily.      OLANZapine  (ZYPREXA ) 5 MG tablet Take 1 tablet by mouth at bedtime.      omeprazole (PRILOSEC) 20 MG capsule Take 20 mg by mouth daily.      ondansetron  (ZOFRAN ) 4 MG tablet Take 4 mg by mouth every 8 (eight) hours as needed for vomiting or nausea.      Semaglutide, 2 MG/DOSE, 8 MG/3ML SOPN Inject 2 mg into the skin every Monday.  sildenafil (VIAGRA) 50 MG tablet Take 75 mg by mouth as needed.      Specialty Vitamins Products (MG PLUS PROTEIN) 133 MG TABS Take 1,330 mg by mouth daily.      tacrolimus  (PROGRAF ) 1 MG capsule Take 1-2 mg by mouth See admin instructions. Take 2mg  by  mouth every morning and take 1mg  by mouth every evening.      traZODone  (DESYREL ) 100 MG tablet Take 200-250 mg by mouth at bedtime.      [DISCONTINUED] glycopyrrolate  (ROBINUL ) 1 MG tablet Take 1 mg by mouth 2 (two) times daily.      [DISCONTINUED] insulin  degludec (TRESIBA) 100 UNIT/ML FlexTouch Pen Inject 40 Units into the skin daily.      [DISCONTINUED] NOVOLIN R 100 UNIT/ML injection Inject 20 Units into the skin 2 (two) times daily before a meal.      [DISCONTINUED] prazosin  (MINIPRESS ) 1 MG capsule Take 1 capsule (1 mg total) by mouth at bedtime.       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   Physician Treatment Plan for Primary Diagnosis: Major depressive disorder, recurrent severe without psychotic features (HCC) Long Term Goal(s): Improvement in symptoms so as ready for discharge   Short Term Goals: Ability to identify changes in lifestyle to reduce recurrence of condition will improve Ability to verbalize feelings will improve Ability to disclose and discuss suicidal ideas Ability to demonstrate self-control will improve Ability to identify and develop effective coping behaviors will improve Ability to maintain clinical measurements within normal limits will improve Ability to identify triggers associated with substance abuse/mental health issues will improve  Medication Management: Evaluate patient's response, side effects, and tolerance of medication regimen.  Therapeutic Interventions: 1 to 1 sessions, Unit Group sessions and Medication administration.  Evaluation of Outcomes: Progressing  Physician Treatment Plan for Secondary Diagnosis:  Principal Problem:   Major depressive disorder, recurrent severe without psychotic features (HCC) Active Problems:   Post-transplant diabetes mellitus (HCC)   S/P liver transplant (HCC)   Hypertension  Long Term Goal(s): Improvement in symptoms so as ready for discharge   Short Term Goals: Ability to identify changes in lifestyle to reduce recurrence of condition will improve Ability to verbalize feelings will improve Ability to disclose and discuss suicidal ideas Ability to demonstrate self-control will improve Ability to identify and develop effective coping behaviors will improve Ability to maintain clinical measurements within normal limits will improve Ability to identify triggers associated with substance abuse/mental health issues will improve     Medication Management: Evaluate patient's response, side effects, and tolerance of medication regimen.  Therapeutic Interventions: 1 to 1 sessions, Unit Group sessions and Medication administration.  Evaluation of Outcomes: Progressing   RN Treatment Plan for Primary Diagnosis: Major depressive disorder, recurrent severe without psychotic features (HCC) Long Term Goal(s): Knowledge of disease and therapeutic regimen to maintain health will improve  Short Term Goals: Ability to remain free from injury will improve, Ability to verbalize frustration and anger appropriately will improve, Ability to verbalize feelings will improve, and Ability to disclose and discuss suicidal ideas  Medication Management: RN will administer medications as ordered by provider, will assess and evaluate patient's response and provide education to patient for prescribed medication. RN  will report any adverse and/or side effects to prescribing provider.  Therapeutic Interventions: 1 on 1 counseling sessions, Psychoeducation, Medication administration, Evaluate responses to treatment, Monitor vital signs and CBGs as ordered, Perform/monitor CIWA, COWS, AIMS and  Fall Risk screenings as ordered, Perform wound care treatments as ordered.  Evaluation of Outcomes: Progressing   LCSW Treatment Plan for Primary Diagnosis: Major depressive disorder, recurrent severe without psychotic features (HCC) Long Term Goal(s): Safe transition to appropriate next level of care at discharge, Engage patient in therapeutic group addressing interpersonal concerns.  Short Term Goals: Engage patient in aftercare planning with referrals and resources, Increase ability to appropriately verbalize feelings, Facilitate acceptance of mental health diagnosis and concerns, and Identify triggers associated with mental health/substance abuse issues  Therapeutic Interventions: Assess for all discharge needs, 1 to 1 time with Social worker, Explore available resources and support systems, Assess for adequacy in community support network, Educate family and significant other(s) on suicide prevention, Complete Psychosocial Assessment, Interpersonal group therapy.  Evaluation of Outcomes: Progressing   Progress in Treatment: Attending groups: attended some groups Participating in groups: Yes Taking medication as prescribed: Yes. Toleration medication: Yes. Family/Significant other contact made: No, will contact:   Stephanie MacFaden (ex-fiancee) [phone]  Patient understands diagnosis: Yes. Discussing patient identified problems/goals with staff: Yes. Medical problems stabilized or resolved: Yes. Denies suicidal/homicidal ideation: Yes. Issues/concerns per patient self-inventory: No. Other: None   New problem(s) identified: No, Describe:  n/a   New Short Term/Long Term Goal(s): detox, medication management for mood stabilization; elimination of SI thoughts; development of comprehensive mental wellness/sobriety plan   Patient Goals:  I want to find a way to beat this depression   Discharge Plan or Barriers: Patient recently admitted. CSW will continue to follow and assess  for appropriate referrals and possible discharge planning.     Reason for Continuation of Hospitalization: Medication stabilization Withdrawal symptoms Other; describe mood stabilization, discharge planning   Estimated Length of Stay: 1 - 2 days  Last 3 [place] Suicide Severity Risk Score: Flowsheet Row Ad[facility] (Current[facility]-Admission (Discharged) from 02/20/2024 in [facility] K[facility] UNIT  C-SSRS RISK CATEGORY No Risk No Risk    Last PHQ 2/9 Scores:     No data to display          Scribe for Treatment Team: Batu Cassin O Stpehen Petitjean, LCSWA 02/27/2024 10:33 AM

## 2024-02-27 NOTE — [facility] Suicide Risk Assessment (Signed)
 Suicide Risk Assessment  Discharge Assessment    [facility] Discharge Suicide Risk Assessment  Principal Problem: Major depressive disorder, recurrent severe without psychotic features (HCC)  Discharge Diagnoses: Principal Problem:   Major depressive disorder, recurrent severe without psychotic features (HCC) Active Problems:   Post-transplant diabetes mellitus (HCC)   S/P liver transplant (HCC)   Hypertension  Total Time spent with patient: Greater than 30 minutes/  Musculoskeletal: Strength & Muscle Tone: within normal limits Gait & Station: normal Patient leans: N/A  Psychiatric Specialty Exam  Presentation  General Appearance:  Appropriate for Environment; Casual; Fairly Groomed  Eye Contact: Good  Speech: Clear and Coherent; Normal Rate  Speech Volume: Normal  Handedness: Left  Mood and Affect  Mood: Euthymic  Duration of Depression Symptoms: No data recorded Affect: Congruent; Appropriate  Thought Process  Thought Processes: Coherent; Goal Directed  Descriptions of Associations:Intact  Orientation:Full (Time, Place and Person)  Thought Content:Logical  History of Schizophrenia/Schizoaffective disorder:No data recorded Duration of Psychotic Symptoms:No data recorded Hallucinations:Hallucinations: None  Ideas of Reference:None  Suicidal Thoughts:Suicidal Thoughts: No  Homicidal Thoughts:Homicidal Thoughts: No   Sensorium  Memory: Immediate Good; Recent Good; Remote Good  Judgment: Good  Insight: Good  Executive Functions  Concentration: Good  Attention Span: Good  Recall: Good  Fund of Knowledge: Good  Language: Good  Psychomotor Activity  Psychomotor Activity:Psychomotor Activity: Normal   Assets  Assets: Communication Skills; Desire for Improvement; Financial Resources/Insurance; Housing; Resilience; Social Support  Sleep  Sleep:Sleep: Good  Estimated Sleeping Duration (Last 24 Hours): 5.75-6.75 hours  Physical  Exam/Ros: See the discharge summary. Blood pressure (!) 125/97, pulse 74, temperature (!) 97.5 F (36.4 C), temperature source Oral, resp. rate 18, height 5' 11 (1.803 m), weight 112 kg, SpO2 100%. Body mass index is 34.45 kg/m.  Mental Status Per Nursing Assessment::   On Admission:  Suicidal ideation indicated by others, Self-harm thoughts, Self-harm behaviors  Demographic Factors:  Male, Adolescent or young adult, Caucasian, and Low socioeconomic status  Loss Factors: Loss of significant relationship  Historical Factors: Family history of suicide, Family history of mental illness or substance abuse, and Impulsivity  Risk Reduction Factors:   Sense of responsibility to family, Positive social support, Positive therapeutic relationship, and Positive coping skills or problem solving skills  Continued Clinical Symptoms:  Depression:   Comorbid alcohol abuse/dependence Impulsivity Alcohol/Substance Abuse/Dependencies More than one psychiatric diagnosis Previous Psychiatric Diagnoses and Treatments Medical Diagnoses and Treatments/Surgeries  Cognitive Features That Contribute To Risk:  Polarized thinking and Thought constriction (tunnel vision)    Suicide Risk:  Minimal: No identifiable suicidal ideation.  Patients presenting with no risk factors but with morbid ruminations; may be classified as minimal risk based on the severity of the depressive symptoms   Follow-up Information     [facility] Go on 03/02/2024.   Specialty: Professional Counselor Why: Please go to this provider on 03/02/24 at 9:00 am for an assessment, to receive therapy services.  You may also go Monday through Friday, from 9 am to 1 pm. Contact information: [address] [place]  [address] [place] [address] [phone]         Donnald Fuss, MD Follow up on 03/01/2024.   Specialty: Psychiatry Why: You have an appointment for medication management services on 03/02/23 at 4:00  pm. Contact information: [address] [place] [address] [phone]                Plan Of Care/Follow-up recommendations:  See the  discharge recommendation above.  Asuncion Layer, NP, pmhnp, fnp-bc. 02/27/2024, 10:07 AM

## 2024-02-27 NOTE — Progress Notes (Signed)
   02/27/24 0910  Spiritual Encounters  Type of Visit Follow up  Care provided to: Patient  Spiritual Framework  Presenting Themes Impactful experiences and emotions;Other (comment) (sudden loss of brother)   I sought to offer f/u support to [employer] following the sudden loss of his brother, Dee Farber.  Aum was appreciative of the check-in he stated he was going to be able to attend the funeral and would leave today to do that.  I offered my sympathies and that I was holding him in prayer, as he had shared his faith background previously with me (Christian, [place]).  Makinzi Prieur L. Minetta Aly, M.Div [phone]

## 2024-02-27 NOTE — Progress Notes (Addendum)
 Nurse discussed anxiety and coping skills with patient.

## 2024-02-27 NOTE — Plan of Care (Signed)
  Problem: Education: Goal: Knowledge of [facility] Education information/materials will improve Outcome: Progressing Goal: Emotional status will improve Outcome: Progressing Goal: Mental status will improve Outcome: Progressing Goal: Verbalization of understanding the information provided will improve Outcome: Progressing   Problem: Activity: Goal: Interest or engagement in activities will improve Outcome: Progressing   Problem: Coping: Goal: Ability to verbalize frustrations and anger appropriately will improve Outcome: Progressing Goal: Ability to demonstrate self-control will improve Outcome: Progressing   Problem: Health Behavior/Discharge Planning: Goal: Identification of resources available to assist in meeting health care needs will improve Outcome: Progressing   Problem: Physical Regulation: Goal: Ability to maintain clinical measurements within normal limits will improve Outcome: Progressing   Problem: Safety: Goal: Periods of time without injury will increase Outcome: Progressing   Problem: Education: Goal: Utilization of techniques to improve thought processes will improve Outcome: Progressing   Problem: Activity: Goal: Interest or engagement in leisure activities will improve Outcome: Progressing Goal: Imbalance in normal sleep/wake cycle will improve Outcome: Progressing   Problem: Health Behavior/Discharge Planning: Goal: Ability to make decisions will improve Outcome: Progressing   Problem: Role Relationship: Goal: Will demonstrate positive changes in social behaviors and relationships Outcome: Progressing   Problem: Safety: Goal: Ability to disclose and discuss suicidal ideas will improve Outcome: Progressing   Problem: Self-Concept: Goal: Will verbalize positive feelings about self Outcome: Progressing Goal: Level of anxiety will decrease Outcome: Progressing

## 2024-02-27 NOTE — Progress Notes (Signed)
D:  Patient denied SI and HI, contracts for safety.  Denied A/V hallucinations.   A:  Medications administered per MD orders.  Emotional support and encouragement given patient. R:  Safety maintained with 15 minute checks.  

## 2024-02-27 NOTE — Care Management Important Message (Signed)
 Patient given IM prior to discharge by CSW.   Allene Furuya, LCSWA

## 2024-02-27 NOTE — Group Note (Signed)
 Date:  02/27/2024 Time:  9:54 AM  Group Topic/Focus:  Goals Group:   The focus of this group is to help patients establish daily goals to achieve during treatment and discuss how the patient can incorporate goal setting into their daily lives to aide in recovery.    Participation Level:  Did Not Attend  Participation Quality:    Affect:    Cognitive:    Insight:   Engagement in Group:    Modes of Intervention:    Additional Comments:  Pt were aware of group time. Pt refused to attend.  Tita Form 02/27/2024, 9:54 AM

## 2024-02-27 NOTE — Discharge Summary (Signed)
 Physician Discharge Summary Note  Patient:  Steve Krueger is an 48 y.o., male MRN:  [id number] DOB:  August 26, 1976 Patient phone:  [phone] (home)  Patient address:   [address] [facility] [address],   Total Time spent with patient: Greater than 30 minutes.  Date of Admission:  02/23/2024  Date of Discharge: 02-27-24  Reason for Admission: Worsening alcohol use triggered by emotional distress following a breakup with his fiance.  Principal Problem: Major depressive disorder, recurrent severe without psychotic features [facility])  Discharge Diagnoses: Principal Problem:   Major depressive disorder, recurrent severe without psychotic features (HCC) Active Problems:   Post-transplant diabetes mellitus (HCC)   S/P liver transplant (HCC)   Hypertension  Past Psychiatric History: See H&P.  Past Medical History:  Past Medical History:  Diagnosis Date   GERD (gastroesophageal reflux disease)    History reviewed. No pertinent surgical history. Family History:  Family History  Problem Relation Age of Onset   Mental illness Mother    Mental illness Brother    Emphysema Other    Kidney failure Other    Gout Father    Family Psychiatric  History: See H&P.  Social History:  Social History   Substance and Sexual Activity  Alcohol Use Yes   Comment: occ     Social History   Substance and Sexual Activity  Drug Use No    Social History   Socioeconomic History   Marital status: Legally Separated    Spouse name: Not on file   Number of children: Not on file   Years of education: Not on file   Highest education level: Not on file  Occupational History   Not on file  Tobacco Use   Smoking status: Every Day    Current packs/day: 1.00    Types: Cigarettes   Smokeless tobacco: Not on file  Substance and Sexual Activity   Alcohol use: Yes    Comment: occ   Drug use: No   Sexual activity: Not on file  Other Topics Concern   Not on file  Social History  Narrative   Not on file   Social Drivers of Health   Financial Resource Strain: Low Risk  (12/17/2023)   Received from [name]   Overall Financial Resource Strain (CARDIA)    Difficulty of Paying Living Expenses: Not hard at all  Food Insecurity: Food Insecurity Present (02/23/2024)   Hunger Vital Sign    Worried About Running Out of Food in the Last Year: Sometimes true    Ran Out of Food in the Last Year: Sometimes true  Transportation Needs: No Transportation Needs (02/23/2024)   PRAPARE - Administrator, Civil Service (Medical): No    Lack of Transportation (Non-Medical): No  Recent Concern: Transportation Needs - Unmet Transportation Needs (12/17/2023)   [facility] - Transportation    In the past 12 months, has lack of transportation kept you from medical appointments or from getting medications?: Yes    Lack of Transportation (Non-Medical): Yes  Physical Activity: Unknown (04/09/2022)   Received from [facility] visits prior to 11/16/2022., [facility]   Exercise Vital Sign    On average, how many days per week do you engage in moderate to strenuous exercise (like a brisk walk)?: Patient declined    Minutes of Exercise per Session: Not on file  Stress: Patient Declined (04/09/2022)   [facility] from [facility] visits prior to 11/16/2022., [facility]   [employer] of Occupational Health - Occupational Stress Questionnaire    Feeling of Stress : Patient declined  Social Connections: Unknown (04/09/2022)   [facility] from [facility] visits prior to 11/16/2022., [facility]   Social Connection and Isolation Panel    In a typical week, how many times do you talk on the phone with family, friends, or neighbors?: Three times a week    How often do you get together with friends or relatives?: Once a week    How often do you attend church or  religious services?: Patient declined    Do you belong to any clubs or organizations such as church groups, unions, fraternal or athletic groups, or school groups?: No    How often do you attend meetings of the clubs or organizations you belong to?: Never    Are you married, widowed, divorced, separated, never married, or living with a partner?: Separated   Hospital Course: (Per admission evaluation notes): 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 He disclosed recent daily alcohol use, consuming approximately one gallon of vodka over the past three days. He attributes this to emotional distress following a breakup with his fiance. Following medical clearance, the patient was admitted to the [facility] on February 23, 2024 for psychiatric treatment and stabilization. 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   Upon the decision by the treatment team to discharge Steve Krueger today, he was seen & evaluated for mood stability. The current laboratory findings were reviewed, stable. The nurses notes & vital signs were reviewed as well. All are stable. At this present time, there are no current mental health or medical issues that should prevent this discharge at this time. Patient is being discharged to continue mental health care & medication management as noted below. He is also aware & agreeable to this discharge  After the above admission evaluation, it was recommended based on his presenting symptoms that Steve Krueger will benefit from mood stabilization treatment/treatment for his alcoholism. Part of his treatment plan besides medication management was a recommendation to participate in other substance abuse treatment program after discharge. And with his consent, he was started on  the medication regimen that targeted his presenting symptoms. He was instructed & explained the benefit/adverse effects of the medication in use. He was given the time to ask questions and voice any concerns that he may have. He received, stabilized & was discharged on the medications as listed below on his discharge medication lists. He was also enrolled & participated in the group counseling sessions being offered and held on this unit. He learned coping skills that should help him after discharge to cope better & maintain mood stability/sobriety.   Holton did express, worry & cried a lot during his early admissions days here that he did not only lose his fiancee, he also lost a place he called home for 3 years. He was worried about being homeless after discharge. While dealing with these issues, Yogi also received a heart breaking news that his older brother had died as well. He was comforted & provided with a lot of emotional support as well. He did see & had some grief counseling session with the hospital chaplain & all these support helped uplift his mood.  Besides his relationship break-up, substance use issues & loss of his residence, Sircharles is also battling other chronic medical conditions including status post liver transplant. He was diligently resumed/discharged on all his other medications for his other medical issues. He tolerated his treatment regimen without any adverse effects or reactions reported. Patient's symptoms responded well to his treatment regimen warranting this discharge.This is evidenced by his daily reports of improved mood, absence of suicidal ideations, homicidal ideations & or AV hallucinations. He is currently mentally & medically stable to be discharged to continue mental health care, medication management & substance abuse treatment as noted below. He was even more excited & hopeful that he is going to be living in a new [place] house setting here in [place], [place].     During the course of his hospitalization, the 15-minute checks were adequate to ensure patient's safety. Patient did not display any dangerous, violent or suicidal behavior on the unit.  He interacted with other patients & staff appropriately. He participated appropriately in the group therapy sessions.  His medications were addressed & adjusted to meet his needs. At the time of discharge,  patient is not reporting any acute suicidal ideations. He feels more confident about his self-care and in managing his mental health. He denies any other issues or concerns. He was able to engage in safety planning including plan to return to [facility] or contact emergency services if he feels unable to maintain his own safety or the safety of others. Patient had no further questions, comments, or concerns. He left [facility] with all personal belongings in no apparent distress. Transportation per the safe transport to the [employer] ED parking lot.  AIMS:  , ,  ,  ,  ,  ,   CIWA:  CIWA-Ar Total: 1 COWS:     Musculoskeletal: Strength & Muscle Tone: within normal limits Gait & Station: normal Patient leans: N/A   Psychiatric Specialty Exam:  Presentation  General Appearance:  Appropriate for Environment; Casual; Fairly Groomed  Eye Contact: Good  Speech: Clear and Coherent; Normal Rate  Speech Volume: Normal  Handedness: Left   Mood and Affect  Mood: Euthymic  Affect: Congruent; Appropriate   Thought Process  Thought Processes: Coherent; Goal Directed  Descriptions of Associations:Intact  Orientation:Full (Time, Place and Person)  Thought Content:Logical  History of Schizophrenia/Schizoaffective disorder:No data recorded Duration of Psychotic Symptoms:No data recorded Hallucinations:Hallucinations: None  Ideas of Reference:None  Suicidal Thoughts:Suicidal Thoughts: No  Homicidal Thoughts:Homicidal Thoughts: No   Sensorium  Memory: Immediate Good; Recent Good; Remote  Good  Judgment: Good  Insight: Good   Executive Functions  Concentration: Good  Attention Span: Good  Recall: Good  Fund of Knowledge: Good  Language: Good   Psychomotor Activity  Psychomotor Activity: Psychomotor Activity: Normal   Assets  Assets: Communication Skills; Desire for Improvement; Financial Resources/Insurance; Housing; Resilience; Social Support   Sleep  Sleep: Sleep: Good  Estimated Sleeping Duration (Last 24 Hours): 5.75-6.75 hours   Physical Exam: Physical Exam Vitals and nursing note reviewed.  HENT:     Head: Normocephalic.     Nose: Nose normal.   Cardiovascular:     Pulses: Normal pulses.     Comments: Hx. HTN.  Patient is on amlodipine  & Lisinopril . Pulmonary:     Effort: Pulmonary effort is normal.  Genitourinary:    Comments: Deferred  Musculoskeletal:        General: Normal range of motion.     Cervical back: Normal range of motion.  Skin:    General: Skin is dry.   Neurological:     General: No focal deficit present.     Mental Status: He is alert and oriented to person, place, and time. Mental status is at baseline.    Review of Systems  Constitutional:  Negative for chills, diaphoresis, fever and malaise/fatigue.  HENT:  Negative for congestion and sore throat.   Eyes:  Negative for blurred vision.  Respiratory:  Negative for cough, shortness of breath and wheezing.   Cardiovascular:  Negative for chest pain and palpitations.  Gastrointestinal:  Negative for abdominal pain, constipation, diarrhea, heartburn, nausea and vomiting.  Genitourinary:  Negative for dysuria.  Musculoskeletal:  Negative for joint pain and myalgias.  Skin:  Negative for rash.  Neurological:  Negative for dizziness, tingling, tremors, sensory change, speech change, focal weakness, seizures, loss of consciousness, weakness and headaches.  Endo/Heme/Allergies:        Allergies: Dilaudid, Morphine .  Psychiatric/Behavioral:  Positive  for depression (H xof (stable on medication).) and substance abuse (Hx. of alcoholism (stable).). Negative for hallucinations, memory loss and suicidal ideas (Hx of.). The patient has insomnia (Hx of (stable on medication).). The patient is not nervous/anxious (Stable upon discharge.).    Blood pressure (!) 125/97, pulse 74, temperature (!) 97.5 F (36.4 C), temperature source Oral, resp. rate 18, height 5' 11 (1.803 m), weight 112 kg, SpO2 100%. Body mass index is 34.45 kg/m.   Social History   Tobacco Use  Smoking Status Every Day   Current packs/day: 1.00   Types: Cigarettes  Smokeless Tobacco Not on file   Tobacco Cessation:  An FDA-approved tobacco cessation medication was recommended at discharge.  Blood Alcohol level:  Lab Results  Component Value Date   [facility] <15 02/20/2024   Metabolic Disorder Labs:  Lab Results  Component Value Date   HGBA1C 6.9 (H) 02/20/2024   MPG 151.33 02/20/2024   No results found for: PROLACTIN No results found for: CHOL, TRIG, HDL, CHOLHDL, VLDL, LDLCALC  See Psychiatric Specialty Exam and Suicide Risk Assessment completed by Attending Physician prior to discharge.  Discharge destination:  Other:  [place] [facility].  Is patient on multiple antipsychotic therapies at discharge:  No   Has Patient had three or more failed trials of antipsychotic monotherapy by history:  No  Recommended Plan for Multiple Antipsychotic Therapies: NA  Allergies as of 02/27/2024       Reactions   Dilaudid [hydromorphone Hcl] Itching   Severe sweating   Morphine  And Codeine Itching        Medication List     STOP taking these medications    Semaglutide (2 MG/DOSE) 8 MG/3ML Sopn   sildenafil 50 MG tablet Commonly known as: VIAGRA       TAKE these medications      Indication  acamprosate  333 MG tablet Commonly known as: CAMPRAL  Take 2 tablets (666 mg total) by mouth 3 (three) times daily with meals. For alcoholism What changed:  additional instructions  Indication: Abuse or Misuse of Alcohol   amLODipine  5 MG tablet Commonly known as: NORVASC  Take 5 mg by mouth daily.  Indication: High Blood Pressure   apixaban  5 MG Tabs tablet Commonly known as: ELIQUIS  Take 5 mg by mouth 2 (two) times daily.  Indication: Prevention of Unwanted Clot in Veins   clonazePAM  0.5 MG tablet Commonly known as: KLONOPIN  Take 1 tablet by mouth 2 (two) times daily as needed.  Indication: Feeling Anxious   FLUoxetine  40 MG capsule Commonly  known as: PROZAC  Take 1 capsule (40 mg total) by mouth daily. For depression Start taking on: February 28, 2024 What changed:  medication strength how much to take additional instructions  Indication: Major Depressive Disorder   folic acid  1 MG tablet Commonly known as: FOLVITE  Take 1 tablet (1 mg total) by mouth daily.  Indication: Anemia From Inadequate Folic Acid    gemfibrozil  600 MG tablet Commonly known as: LOPID  Take 600 mg by mouth 2 (two) times daily before a meal.  Indication: High Amount of Triglycerides in the Blood   glycopyrrolate  1 MG tablet Commonly known as: ROBINUL  Take 1 tablet (1 mg total) by mouth 2 (two) times daily. For excessive salivation. What changed: additional instructions  Indication: Excessive Sweating Disorder   insulin  degludec 100 UNIT/ML FlexTouch Pen Commonly known as: TRESIBA Inject 40 Units into the skin daily. For diabetes management. What changed: additional instructions  Indication: Type 2 Diabetes   lisinopril  20 MG tablet Commonly known as: ZESTRIL  Take 20 mg by mouth daily.  Indication: High Blood Pressure   metFORMIN  500 MG 24 hr tablet Commonly known as: GLUCOPHAGE -XR Take 1 tablet by mouth 2 (two) times daily.  Indication: Type 2 Diabetes   MG Plus Protein 133 MG Tabs Take 1,330 mg by mouth daily.  Indication: Vitamin supplementation/   mycophenolate  250 MG capsule Commonly known as: CELLCEPT  Take 250 mg by mouth 2 (two)  times daily.  Indication: Liver Transplant Recipient   nicotine  14 mg/24hr patch Commonly known as: NICODERM CQ  - dosed in mg/24 hours Place 1 patch (14 mg total) onto the skin daily as needed (tobacco dependence).  Indication: Nicotine  Addiction   nicotine  polacrilex 2 MG gum Commonly known as: NICORETTE  Take 1 each (2 mg total) by mouth as needed for smoking cessation.  Indication: Nicotine  Addiction   NovoLIN R 100 UNIT/ML injection Generic drug: insulin  regular Inject 0.2 mLs (20 Units total) into the skin 2 (two) times daily before a meal. For diabetes management. What changed: additional instructions  Indication: Type 2 Diabetes   OLANZapine  10 MG tablet Commonly known as: ZYPREXA  Take 1 tablet (10 mg total) by mouth at bedtime. For mood control What changed:  medication strength how much to take additional instructions  Indication: Mood control   omeprazole 20 MG capsule Commonly known as: PRILOSEC Take 20 mg by mouth daily.  Indication: Gastroesophageal Reflux Disease   ondansetron  4 MG tablet Commonly known as: ZOFRAN  Take 4 mg by mouth every 8 (eight) hours as needed for vomiting or nausea.  Indication: Nausea and Vomiting   prazosin  1 MG capsule Commonly known as: MINIPRESS  Take 1 capsule (1 mg total) by mouth at bedtime. For nightmares. What changed: additional instructions  Indication: Frightening Dreams   tacrolimus  1 MG capsule Commonly known as: PROGRAF  Take 1-2 mg by mouth See admin instructions. Take 2mg  by mouth every morning and take 1mg  by mouth every evening.  Indication: Liver Transplant Recipient   thiamine  100 MG tablet Commonly known as: Vitamin B-1 Take 1 tablet (100 mg total) by mouth daily.  Indication: Deficiency of Vitamin B1   traZODone  100 MG tablet Commonly known as: DESYREL  Take 2 tablets (200 mg total) by mouth at bedtime. For sleep. What changed:  how much to take additional instructions  Indication: Trouble Sleeping         Follow-up Information     [facility] Go on 03/02/2024.   Specialty: Professional Counselor Why: Please go to this provider on  03/02/24 at 9:00 am for an assessment, to receive therapy services.  You may also go Monday through Friday, from 9 am to 1 pm. Contact information: [address] [place] [address] [phone]         Donnald Fuss, MD Follow up on 03/01/2024.   Specialty: Psychiatry Why: You have an appointment for medication management services on 03/02/23 at 4:00 pm. Contact information: [address] [place] [address] [phone]                Follow-up recommendations:  Activity:  As tolerated Diet: As recommended by your primary care doctor. Keep all scheduled follow-up appointments as recommended.  Comments: Plan Of Care/Follow-up recommendations:  Activity: as tolerated  Diet: heart healthy  Other: -Follow-up with your outpatient psychiatric provider -instructions on appointment date, time, and address (location) are provided to you in discharge paperwork.  -Take your psychiatric medications as prescribed at discharge - instructions are provided to you in the discharge paperwork  -Follow-up with outpatient primary care doctor and other specialists -for management of preventative medicine and chronic medical issues  -Testing: Follow-up with outpatient provider for abnormal lab results: NA  -If you are prescribed an atypical antipsychotic medication, we recommend that your outpatient psychiatrist follow routine screening for side effects within 3 months of discharge, including monitoring: AIMS scale, height, weight, blood pressure, fasting lipid panel, HbA1c, and fasting blood sugar.   -Recommend total abstinence from alcohol, tobacco, and other illicit drug use at discharge.   -If your psychiatric symptoms recur, worsen, or if you have side effects to your psychiatric medications,  call your outpatient psychiatric provider, 911, [phone] or go to the nearest emergency department.  -If suicidal thoughts occur, immediately call your outpatient psychiatric provider, 911, [phone] or go to the nearest emergency department.  Signed: Asuncion Layer, NP, pmhnp, fnp-bc. 02/27/2024, 10:34 AM

## 2024-02-27 NOTE — Group Note (Signed)
 Recreation Therapy Group Note   Group Topic:Problem Solving  Group Date: 02/27/2024 Start Time: 1610 End Time: 1000 Facilitators: Raequon Catanzaro-McCall, LRT,CTRS Location: [address] Dayroom   Group Topic: Problem Solving  Goal Area(s) Addresses:  Patient will effectively work in a team with other group members. Patient will verbalize importance of using appropriate problem solving techniques.   Behavioral Response: Engaged  Intervention: Worksheet  Activity: Dentist. Patients were given two worksheets of brain teasers. Patients got 15 minutes to complete the puzzles. Patients could work with each other if they chose to to figure out what each puzzle was. At the end of the 15 minutes, LRT would go over the answers with the group.     Education: Journalist, newspaper, Communication, Team Building  Education Outcome: Acknowledges understanding/In group clarification offered/Needs additional education.    Affect/Mood: Appropriate   Participation Level: Engaged   Participation Quality: Independent   Behavior: Appropriate   Speech/Thought Process: Focused   Insight: Good   Judgement: Good   Modes of Intervention: Activity   Patient Response to Interventions:  Engaged   Education Outcome:  In group clarification offered    Clinical Observations/Individualized Feedback: Pt was bright and vocal during group session in sharing the answers with peers during activity. Pt was called out of group and didn't return until group had ended.     Plan: Continue to engage patient in RT group sessions 2-3x/week.   Lular Letson-McCall, LRT,CTRS 02/27/2024 12:17 PM

## 2024-08-04 ENCOUNTER — Other Ambulatory Visit: Payer: Self-pay | Admitting: Orthopedic Surgery

## 2024-08-06 ENCOUNTER — Encounter (HOSPITAL_COMMUNITY): Payer: Self-pay | Admitting: *Deleted

## 2024-08-06 ENCOUNTER — Other Ambulatory Visit: Payer: Self-pay

## 2024-08-06 ENCOUNTER — Emergency Department (HOSPITAL_COMMUNITY)
Admission: EM | Admit: 2024-08-06 | Discharge: 2024-08-07 | Disposition: A | Discharge status: Home / Self Care | Payer: Medicare (Managed Care) | Attending: Emergency Medicine | Admitting: Emergency Medicine

## 2024-08-06 ENCOUNTER — Emergency Department (HOSPITAL_COMMUNITY): Payer: Medicare (Managed Care)

## 2024-08-06 DIAGNOSIS — R109 Unspecified abdominal pain: Secondary | ICD-10-CM | POA: Insufficient documentation

## 2024-08-06 DIAGNOSIS — Z794 Long term (current) use of insulin: Secondary | ICD-10-CM | POA: Diagnosis not present

## 2024-08-06 DIAGNOSIS — Z79899 Other long term (current) drug therapy: Secondary | ICD-10-CM | POA: Insufficient documentation

## 2024-08-06 DIAGNOSIS — Z7984 Long term (current) use of oral hypoglycemic drugs: Secondary | ICD-10-CM | POA: Insufficient documentation

## 2024-08-06 DIAGNOSIS — Z7901 Long term (current) use of anticoagulants: Secondary | ICD-10-CM | POA: Diagnosis not present

## 2024-08-06 DIAGNOSIS — I10 Essential (primary) hypertension: Secondary | ICD-10-CM | POA: Diagnosis not present

## 2024-08-06 DIAGNOSIS — E1165 Type 2 diabetes mellitus with hyperglycemia: Secondary | ICD-10-CM | POA: Diagnosis not present

## 2024-08-06 DIAGNOSIS — R58 Hemorrhage, not elsewhere classified: Secondary | ICD-10-CM | POA: Diagnosis not present

## 2024-08-06 HISTORY — DX: Liver transplant status: Z94.4

## 2024-08-06 HISTORY — DX: Type 2 diabetes mellitus without complications: E11.9

## 2024-08-06 HISTORY — DX: Essential (primary) hypertension: I10

## 2024-08-06 LAB — COMPREHENSIVE METABOLIC PANEL WITH GFR
ALT: 40 U/L (ref 0–44)
AST: 28 U/L (ref 15–41)
Albumin: 3.5 g/dL (ref 3.5–5.0)
Alkaline Phosphatase: 82 U/L (ref 38–126)
Anion gap: 9 (ref 5–15)
BUN: 9 mg/dL (ref 6–20)
CO2: 22 mmol/L (ref 22–32)
Calcium: 8.7 mg/dL — ABNORMAL LOW (ref 8.9–10.3)
Chloride: 105 mmol/L (ref 98–111)
Creatinine, Ser: 0.8 mg/dL (ref 0.61–1.24)
GFR, Estimated: >60 mL/min (ref >60)
Glucose, Bld: 225 mg/dL — ABNORMAL HIGH (ref 70–99)
Potassium: 4.1 mmol/L (ref 3.5–5.1)
Sodium: 136 mmol/L (ref 135–145)
Total Bilirubin: 0.6 mg/dL (ref 0.0–1.2)
Total Protein: 6.1 g/dL — ABNORMAL LOW (ref 6.5–8.1)

## 2024-08-06 LAB — CBC WITH DIFFERENTIAL/PLATELET
Abs Immature Granulocytes: 0.04 K/uL (ref 0.00–0.07)
Basophils Absolute: 0 K/uL (ref 0.0–0.1)
Basophils Relative: 1 %
Eosinophils Absolute: 0.1 K/uL (ref 0.0–0.5)
Eosinophils Relative: 2 %
HCT: 42.4 % (ref 39.0–52.0)
Hemoglobin: 14.1 g/dL (ref 13.0–17.0)
Immature Granulocytes: 1 %
Lymphocytes Relative: 19 %
Lymphs Abs: 1 K/uL (ref 0.7–4.0)
MCH: 29.1 pg (ref 26.0–34.0)
MCHC: 33.3 g/dL (ref 30.0–36.0)
MCV: 87.4 fL (ref 80.0–100.0)
Monocytes Absolute: 0.5 K/uL (ref 0.1–1.0)
Monocytes Relative: 10 %
Neutro Abs: 3.5 K/uL (ref 1.7–7.7)
Neutrophils Relative %: 67 %
Platelets: 121 K/uL — ABNORMAL LOW (ref 150–400)
RBC: 4.85 MIL/uL (ref 4.22–5.81)
RDW: 13.2 % (ref 11.5–15.5)
WBC: 5.2 K/uL (ref 4.0–10.5)
nRBC: 0 % (ref 0.0–0.2)

## 2024-08-06 LAB — URINALYSIS, ROUTINE W REFLEX MICROSCOPIC
Bilirubin Urine: NEGATIVE
Glucose, UA: NEGATIVE mg/dL
Hgb urine dipstick: NEGATIVE
Ketones, ur: NEGATIVE mg/dL
Leukocytes,Ua: NEGATIVE
Nitrite: NEGATIVE
Protein, ur: NEGATIVE mg/dL
Specific Gravity, Urine: 1.015 (ref 1.005–1.030)
pH: 6 (ref 5.0–8.0)

## 2024-08-06 LAB — PROTIME-INR
INR: 1 (ref 0.8–1.2)
Prothrombin Time: 13.6 s (ref 11.4–15.2)

## 2024-08-06 LAB — LIPASE, BLOOD: Lipase: 42 U/L (ref 11–51)

## 2024-08-06 MED ORDER — IOHEXOL 350 MG/ML SOLN
80.0000 mL | Freq: Once | INTRAVENOUS | Status: AC | PRN
  Administered 2024-08-06: 80 mL via INTRAVENOUS

## 2024-08-06 NOTE — ED Triage Notes (Signed)
 Pt c/o abdominal pain that started yesterday and noticed a lump today on the right side of his abdomen. Denies n/v/d. Hx of hernia repair and liver transplant at [facility].

## 2024-08-06 NOTE — ED Provider Notes (Incomplete)
 [place] EMERGENCY DEPARTMENT AT [facility] Provider Note   CSN: [id number] Arrival date & time: 08/06/24  2137     Patient presents with: Abdominal Pain   Steve Krueger is a 48 y.o. male with history of hypertension, GERD, diabetes, nausea vomiting diarrhea, alcohol withdrawal, liver transplant on any nausea like medication.  Presents to ED complaining of abdominal pain right sided.  States that abdominal pain began yesterday and was located to right side.  States he has had persistent abdominal pain since onset.  Denies nausea or vomiting or diarrhea.  States last bowel movement was today, normal, no blood in stool.  Denies dysuria, flank pain, fevers, chest pain or shortness of breath.  Reports history of liver transplant.  Denies alcohol use.  Reports history of cholecystectomy.  States that he feels a lump on the right side of his abdomen and he has associated ecchymosis to this area.  Reports compliance of blood thinning medication.   Abdominal Pain      Prior to Admission medications   Medication Sig Start Date End Date Taking? Authorizing Provider  acamprosate  (CAMPRAL ) 333 MG tablet Take 2 tablets (666 mg total) by mouth 3 (three) times daily with meals. For alcoholism 02/27/24   Collene Gouge I, NP  amLODipine  (NORVASC ) 5 MG tablet Take 5 mg by mouth daily. 04/18/23 04/17/24  [provider]  apixaban  (ELIQUIS ) 5 MG TABS tablet Take 5 mg by mouth 2 (two) times daily. 05/08/23   [provider]  clonazePAM  (KLONOPIN ) 0.5 MG tablet Take 1 tablet by mouth 2 (two) times daily as needed. 08/29/23   [provider]  FLUoxetine  (PROZAC ) 40 MG capsule Take 1 capsule (40 mg total) by mouth daily. For depression 02/28/24   Collene Gouge I, NP  folic acid  (FOLVITE ) 1 MG tablet Take 1 tablet (1 mg total) by mouth daily. 02/23/24   Barbarann Nest, MD  gemfibrozil  (LOPID ) 600 MG tablet Take 600 mg by mouth 2 (two) times daily before a meal. 08/30/22    [provider]  glycopyrrolate  (ROBINUL ) 1 MG tablet Take 1 tablet (1 mg total) by mouth 2 (two) times daily. For excessive salivation. 02/27/24   Collene Gouge I, NP  insulin  degludec (TRESIBA ) 100 UNIT/ML FlexTouch Pen Inject 40 Units into the skin daily. For diabetes management. 02/27/24   Collene Gouge I, NP  lisinopril  (ZESTRIL ) 20 MG tablet Take 20 mg by mouth daily. 10/16/22 10/19/24  [provider]  metFORMIN  (GLUCOPHAGE -XR) 500 MG 24 hr tablet Take 1 tablet by mouth 2 (two) times daily. 11/06/22 01/29/25  [provider]  mycophenolate  (CELLCEPT ) 250 MG capsule Take 250 mg by mouth 2 (two) times daily. 02/21/21 01/25/25  [provider]  nicotine  (NICODERM CQ  - DOSED IN MG/24 HOURS) 14 mg/24hr patch Place 1 patch (14 mg total) onto the skin daily as needed (tobacco dependence). 02/22/24   Barbarann Nest, MD  nicotine  polacrilex (NICORETTE ) 2 MG gum Take 1 each (2 mg total) by mouth as needed for smoking cessation. 02/27/24   Collene Gouge I, NP  NOVOLIN  R 100 UNIT/ML injection Inject 0.2 mLs (20 Units total) into the skin 2 (two) times daily before a meal. For diabetes management. 02/27/24   Collene Gouge I, NP  OLANZapine  (ZYPREXA ) 10 MG tablet Take 1 tablet (10 mg total) by mouth at bedtime. For mood control 02/27/24   Collene Gouge I, NP  omeprazole (PRILOSEC) 20 MG capsule Take 20 mg by mouth daily.  [provider]  ondansetron  (ZOFRAN ) 4 MG tablet Take 4 mg by mouth every 8 (eight) hours as needed for vomiting or nausea. 07/09/19   [provider]  prazosin  (MINIPRESS ) 1 MG capsule Take 1 capsule (1 mg total) by mouth at bedtime. For nightmares. 02/27/24   Collene Mac FERNS, NP  Specialty Vitamins Products (MG PLUS PROTEIN) 133 MG TABS Take 1,330 mg by mouth daily. 11/21/23 11/20/24  [provider]  tacrolimus  (PROGRAF ) 1 MG capsule Take 1-2 mg by mouth See admin instructions. Take 2mg  by mouth every morning and take 1mg  by mouth every evening.  03/30/21 12/02/24  [provider]  thiamine  (VITAMIN B-1) 100 MG tablet Take 1 tablet (100 mg total) by mouth daily. 02/23/24   Barbarann Nest, MD  traZODone  (DESYREL ) 100 MG tablet Take 2 tablets (200 mg total) by mouth at bedtime. For sleep. 02/27/24   Collene Mac I, NP    Allergies: Dilaudid [hydromorphone hcl] and Morphine  and codeine    Review of Systems  Gastrointestinal:  Positive for abdominal pain.    Updated Vital Signs BP (!) 165/94 (BP Location: Right Arm)   Pulse 82   Temp 97.8 F (36.6 C)   Resp 18   Ht 5' 11 (1.803 m)   Wt 112 kg   SpO2 97%   BMI 34.44 kg/m   Physical Exam  (all labs ordered are listed, but only abnormal results are displayed) Labs Reviewed  COMPREHENSIVE METABOLIC PANEL WITH GFR - Abnormal; Notable for the following components:      Result Value   Glucose, Bld 225 (*)    Calcium  8.7 (*)    Total Protein 6.1 (*)    All other components within normal limits  CBC WITH DIFFERENTIAL/PLATELET - Abnormal; Notable for the following components:   Platelets 121 (*)    All other components within normal limits  LIPASE, BLOOD  URINALYSIS, ROUTINE W REFLEX MICROSCOPIC  PROTIME-INR    EKG: None  Radiology: No results found.  {Document cardiac monitor, telemetry assessment procedure when appropriate:32947} Procedures   Medications Ordered in the ED - No data to display    {Click here for ABCD2, HEART and other calculators REFRESH Note before signing:1}                              Medical Decision Making  ***  {Document critical care time when appropriate  Document review of labs and clinical decision tools ie CHADS2VASC2, etc  Document your independent review of radiology images and any outside records  Document your discussion with family members, caretakers and with consultants  Document social determinants of health affecting pt's care  Document your decision making why or why not admission, treatments were  needed:32947:::1}   Final diagnoses:  None    ED Discharge Orders     None

## 2024-08-06 NOTE — ED Provider Triage Note (Signed)
 Emergency Medicine Provider Triage Evaluation Note  MITCH ARQUETTE , a 48 y.o. male  was evaluated in triage.  Pt complains of abdominal pain, history of liver transplant.  Patient is on Eliquis   Review of Systems  Positive: Right sided abdominal pain, palpable mass, ecchymosis Negative: Fever, chills, nausea, vomiting, diarrhea, constipation, injury  Physical Exam  BP (!) 165/94 (BP Location: Right Arm)   Pulse 82   Temp 97.8 F (36.6 C)   Resp 18   Ht 5' 11 (1.803 m)   Wt 112 kg   SpO2 97%   BMI 34.44 kg/m  Gen:   Awake, no distress   Resp:  Normal effort  MSK:   Moves extremities without difficulty  Other:  Ecchymosis noted in area of discomfort.  Medical Decision Making  Medically screening exam initiated at 9:53 PM.  Appropriate orders placed.  TRAYSON STITELY was informed that the remainder of the evaluation will be completed by another provider, this initial triage assessment does not replace that evaluation, and the importance of remaining in the ED until their evaluation is complete.  Labs and imaging ordered   Francis Ileana LOISE DEVONNA 08/06/24 2155

## 2024-08-06 NOTE — ED Provider Notes (Signed)
 [place] EMERGENCY DEPARTMENT AT [facility] Provider Note   CSN: [id number] Arrival date & time: 08/06/24  2137     Patient presents with: Abdominal Pain   Steve Krueger is a 48 y.o. male with history of hypertension, GERD, diabetes, nausea vomiting diarrhea, alcohol withdrawal, liver transplant on any nausea like medication.  Presents to ED complaining of abdominal pain right sided.  States that abdominal pain began yesterday and was located to right side.  States he has had persistent abdominal pain since onset.  Denies nausea or vomiting or diarrhea.  States last bowel movement was today, normal, no blood in stool.  Denies dysuria, flank pain, fevers, chest pain or shortness of breath.  Reports history of liver transplant.  Denies alcohol use.  Reports history of cholecystectomy.  States that he feels a lump on the right side of his abdomen and he has associated ecchymosis to this area.  Reports compliance of blood thinning medication.   Abdominal Pain      Prior to Admission medications   Medication Sig Start Date End Date Taking? Authorizing Provider  acamprosate  (CAMPRAL ) 333 MG tablet Take 2 tablets (666 mg total) by mouth 3 (three) times daily with meals. For alcoholism 02/27/24   Collene Gouge I, NP  amLODipine  (NORVASC ) 5 MG tablet Take 5 mg by mouth daily. 04/18/23 04/17/24  [provider]  apixaban  (ELIQUIS ) 5 MG TABS tablet Take 5 mg by mouth 2 (two) times daily. 05/08/23   [provider]  clonazePAM  (KLONOPIN ) 0.5 MG tablet Take 1 tablet by mouth 2 (two) times daily as needed. 08/29/23   [provider]  FLUoxetine  (PROZAC ) 40 MG capsule Take 1 capsule (40 mg total) by mouth daily. For depression 02/28/24   Collene Gouge I, NP  folic acid  (FOLVITE ) 1 MG tablet Take 1 tablet (1 mg total) by mouth daily. 02/23/24   Barbarann Nest, MD  gemfibrozil  (LOPID ) 600 MG tablet Take 600 mg by mouth 2 (two) times daily before a meal. 08/30/22    [provider]  glycopyrrolate  (ROBINUL ) 1 MG tablet Take 1 tablet (1 mg total) by mouth 2 (two) times daily. For excessive salivation. 02/27/24   Collene Gouge I, NP  insulin  degludec (TRESIBA ) 100 UNIT/ML FlexTouch Pen Inject 40 Units into the skin daily. For diabetes management. 02/27/24   Collene Gouge I, NP  lisinopril  (ZESTRIL ) 20 MG tablet Take 20 mg by mouth daily. 10/16/22 10/19/24  [provider]  metFORMIN  (GLUCOPHAGE -XR) 500 MG 24 hr tablet Take 1 tablet by mouth 2 (two) times daily. 11/06/22 01/29/25  [provider]  mycophenolate  (CELLCEPT ) 250 MG capsule Take 250 mg by mouth 2 (two) times daily. 02/21/21 01/25/25  [provider]  nicotine  (NICODERM CQ  - DOSED IN MG/24 HOURS) 14 mg/24hr patch Place 1 patch (14 mg total) onto the skin daily as needed (tobacco dependence). 02/22/24   Barbarann Nest, MD  nicotine  polacrilex (NICORETTE ) 2 MG gum Take 1 each (2 mg total) by mouth as needed for smoking cessation. 02/27/24   Collene Gouge I, NP  NOVOLIN  R 100 UNIT/ML injection Inject 0.2 mLs (20 Units total) into the skin 2 (two) times daily before a meal. For diabetes management. 02/27/24   Collene Gouge I, NP  OLANZapine  (ZYPREXA ) 10 MG tablet Take 1 tablet (10 mg total) by mouth at bedtime. For mood control 02/27/24   Collene Gouge I, NP  omeprazole (PRILOSEC) 20 MG capsule Take 20 mg by mouth daily.  [provider]  ondansetron  (ZOFRAN ) 4 MG tablet Take 4 mg by mouth every 8 (eight) hours as needed for vomiting or nausea. 07/09/19   [provider]  prazosin  (MINIPRESS ) 1 MG capsule Take 1 capsule (1 mg total) by mouth at bedtime. For nightmares. 02/27/24   Collene Mac FERNS, NP  Specialty Vitamins Products (MG PLUS PROTEIN) 133 MG TABS Take 1,330 mg by mouth daily. 11/21/23 11/20/24  [provider]  tacrolimus  (PROGRAF ) 1 MG capsule Take 1-2 mg by mouth See admin instructions. Take 2mg  by mouth every morning and take 1mg  by mouth every evening.  03/30/21 12/02/24  [provider]  thiamine  (VITAMIN B-1) 100 MG tablet Take 1 tablet (100 mg total) by mouth daily. 02/23/24   Barbarann Nest, MD  traZODone  (DESYREL ) 100 MG tablet Take 2 tablets (200 mg total) by mouth at bedtime. For sleep. 02/27/24   Collene Mac I, NP    Allergies: Dilaudid [hydromorphone hcl] and Morphine  and codeine    Review of Systems  Gastrointestinal:  Positive for abdominal pain.    Updated Vital Signs BP (!) 148/93   Pulse 85   Temp 97.8 F (36.6 C)   Resp 18   Ht 5' 11 (1.803 m)   Wt 112 kg   SpO2 94%   BMI 34.44 kg/m   Physical Exam Vitals and nursing note reviewed.  Constitutional:      General: He is not in acute distress.    Appearance: He is well-developed.  HENT:     Head: Normocephalic and atraumatic.  Eyes:     Conjunctiva/sclera: Conjunctivae normal.  Cardiovascular:     Rate and Rhythm: Normal rate and regular rhythm.     Heart sounds: No murmur heard. Pulmonary:     Effort: Pulmonary effort is normal. No respiratory distress.     Breath sounds: Normal breath sounds.  Abdominal:     Palpations: Abdomen is soft.     Tenderness: There is no abdominal tenderness.     Comments: Abdomen soft, compressible.  Ecchymosis to right mid abdomen.  No identifiable or palpable mass noted.  No tenderness noted.  No CVA tenderness.  Musculoskeletal:        General: No swelling.     Cervical back: Neck supple.     Right lower leg: No edema.     Left lower leg: No edema.     Comments: No bilateral lower extremity edema  Skin:    General: Skin is warm and dry.     Capillary Refill: Capillary refill takes less than 2 seconds.  Neurological:     Mental Status: He is alert and oriented to person, place, and time. Mental status is at baseline.  Psychiatric:        Mood and Affect: Mood normal.     (all labs ordered are listed, but only abnormal results are displayed) Labs Reviewed  COMPREHENSIVE METABOLIC PANEL WITH GFR - Abnormal;  Notable for the following components:      Result Value   Glucose, Bld 225 (*)    Calcium  8.7 (*)    Total Protein 6.1 (*)    All other components within normal limits  CBC WITH DIFFERENTIAL/PLATELET - Abnormal; Notable for the following components:   Platelets 121 (*)    All other components within normal limits  LIPASE, BLOOD  URINALYSIS, ROUTINE W REFLEX MICROSCOPIC  PROTIME-INR    EKG: None  Radiology: CT ABDOMEN PELVIS W CONTRAST Result Date: 08/06/2024 EXAM: CT ABDOMEN AND  PELVIS WITH CONTRAST 08/06/2024 11:24:00 PM TECHNIQUE: CT of the abdomen and pelvis was performed with the administration of 80 mL of iohexol  (OMNIPAQUE ) 350 MG/ML injection. Multiplanar reformatted images are provided for review. Automated exposure control, iterative reconstruction, and/or weight-based adjustment of the mA/kV was utilized to reduce the radiation dose to as low as reasonably achievable. COMPARISON: 02/20/2024 CLINICAL HISTORY: Abdominal pain, acute, nonlocalized. FINDINGS: LOWER CHEST: No acute abnormality. LIVER: The liver is unremarkable. GALLBLADDER AND BILE DUCTS: Gallbladder is unremarkable. No biliary ductal dilatation. SPLEEN: Normal size. No focal abnormality. PANCREAS: No acute abnormality. ADRENAL GLANDS: No acute abnormality. KIDNEYS, URETERS AND BLADDER: No stones in the kidneys or ureters. No hydronephrosis. No perinephric or periureteral stranding. Urinary bladder is unremarkable. GI AND BOWEL: Stomach demonstrates no acute abnormality. There is no bowel obstruction. Normal appendix. PERITONEUM AND RETROPERITONEUM: No ascites. No free air. VASCULATURE: Aorta is normal in caliber. Scattered aortic atherosclerosis. Calcifications at the confluence of the splenic vein with the portal vein, unchanged since prior study. No portal vein thrombosis. LYMPH NODES: No lymphadenopathy. REPRODUCTIVE ORGANS: No acute abnormality. BONES AND SOFT TISSUES: No acute osseous abnormality. No focal soft tissue  abnormality. IMPRESSION: 1. No acute findings in the abdomen or pelvis. Electronically signed by: Franky Crease MD 08/06/2024 11:45 PM EST RP Workstation: [id number]    Procedures   Medications Ordered in the ED  iohexol  (OMNIPAQUE ) 350 MG/ML injection 80 mL (80 mLs Intravenous Contrast Given 08/06/24 2338)     Medical Decision Making  48 year old presents for evaluation.  Please see HPI.  48 year old male with history liver transplant on IV modulating medication presents with right mid abdomen pain noticed yesterday.  On exam, HD stable.  Afebrile, nontachycardic.  Lung sounds are clear bilaterally, no hypoxia.  Abdomen is soft and compressible, no tenderness.  There is ecchymosis in right mid abdomen however patient reports that this is where he typically places his Dexcom and this has occurred in the past.  He has no rebound or guarding.  Neurological examination at baseline.  Overall nontoxic in appearance.  Labs assessed in triage include CBC, CMP, lipase, PT/INR, urinalysis.  CT abdomen pelvis.  CBC without leukocytosis or anemia.  Metabolic panel with glucose 225, no elevated LFT, anion gap 9, no electrolyte derangement.  PT/INR WNL however patient reports he is compliant on Eliquis .  Urinalysis negative for all.   CT abdomen pelvis shows no acute findings.  At this time patient has no abdominal tenderness.  No nausea or vomiting.  He is overall nontoxic appearance and resting comfortably in bed.  Advised patient negative workup.  Have encouraged him to follow-up with his liver transplant team.  He voiced understanding.  Return precautions given.  He is stable to discharge.   Final diagnoses:  Abdominal pain, unspecified abdominal location    ED Discharge Orders     None          Ruthell Lonni JULIANNA DEVONNA 08/07/24 0016    Darra Fonda MATSU, MD 08/07/24 [phone]

## 2024-08-07 NOTE — Discharge Instructions (Signed)
 As discussed, your workup here is reassuring.  Please follow-up with your liver transplant team.  Return to ED with nausea, vomiting, diarrhea, fevers.

## 2024-09-30 ENCOUNTER — Ambulatory Visit (HOSPITAL_BASED_OUTPATIENT_CLINIC_OR_DEPARTMENT_OTHER): Admit: 2024-09-30 | Payer: Medicare (Managed Care) | Admitting: Orthopedic Surgery

## 2024-09-30 ENCOUNTER — Encounter (HOSPITAL_BASED_OUTPATIENT_CLINIC_OR_DEPARTMENT_OTHER): Payer: Self-pay

## 2024-09-30 SURGERY — CARPAL TUNNEL RELEASE
Anesthesia: Regional | Site: Wrist | Laterality: Left
